# Patient Record
Sex: Male | Born: 1960 | Race: White | Hispanic: No | Marital: Married
Health system: Midwestern US, Community
[De-identification: ages and names within clinical notes are randomized; demographics above are authoritative.]

## PROBLEM LIST (undated history)

## (undated) DIAGNOSIS — I480 Paroxysmal atrial fibrillation: Principal | ICD-10-CM

## (undated) DIAGNOSIS — I059 Rheumatic mitral valve disease, unspecified: Secondary | ICD-10-CM

## (undated) DIAGNOSIS — C3412 Malignant neoplasm of upper lobe, left bronchus or lung: Principal | ICD-10-CM

## (undated) DIAGNOSIS — I10 Essential (primary) hypertension: Secondary | ICD-10-CM

## (undated) DIAGNOSIS — C3402 Malignant neoplasm of left main bronchus: Secondary | ICD-10-CM

## (undated) DIAGNOSIS — I4819 Other persistent atrial fibrillation: Secondary | ICD-10-CM

---

## 2015-02-20 ENCOUNTER — Ambulatory Visit
Admit: 2015-02-20 | Discharge: 2015-02-20 | Payer: PRIVATE HEALTH INSURANCE | Attending: Cardiovascular Disease | Primary: Internal Medicine

## 2015-02-20 DIAGNOSIS — R0602 Shortness of breath: Secondary | ICD-10-CM

## 2015-02-20 NOTE — Progress Notes (Signed)
02/20/2015     Bobby Guzman, [address]  [place] [address]        Patient:  Bobby Guzman   DOB:  Sep 19, 1961       CHIEF COMPLAINT:   Chief Complaint   Patient presents with   ??? Shortness of Breath     s/p endo or myocarditis , lung CA s/p LUL    ??? Chest Pain (Angina)     at [facility] 2015   ??? Dizziness   ??? Palpitations   ??? Leg Swelling     leg pain   ??? Sleep Apnea     has cpap       Dear Dr. Hanley Seamen,    HISTORY OF PRESENT ILLNESS: Today I had the pleasure of seeing your patient, Bobby Guzman in cardiology consultation. As you know, he is a 54 y.o. year old male who  has a past medical history of Lung cancer ([place]) (2011); Sleep apnea; Essential hypertension; Diabetes ([place]); and Endocarditis (2015). presenting for cardiac evaluation.    DOE -- unchanged >1 yr. Denies CP/pressure. Occasional mild dizziness. Denies syncope. Denies palpitations. Notes claudication bilaterally with prolonged walking.    PAST MEDICAL HISTORY:  Past Medical History   Diagnosis Date   ??? Lung cancer ([place]) 2011     s/p LUL resection and chemo   ??? Sleep apnea    ??? Essential hypertension    ??? Diabetes ([place])    ??? Endocarditis 2015     dx [facility]        PAST SURGICAL HISTORY:  Past Surgical History   Procedure Laterality Date   ??? Hx other surgical  2011     LUL resection       MEDICATIONS:  Current Outpatient Prescriptions   Medication Sig Dispense Refill   ??? candesartan (ATACAND) 4 mg tablet Take  by mouth daily.     ??? rosuvastatin (CRESTOR) 10 mg tablet Take 10 mg by mouth nightly.     ??? sitaGLIPtin-metFORMIN (JANUMET) 50-1,000 mg per tablet Take 1 Tab by mouth two (2) times daily (with meals).           ALLERGIES:   Allergies not on file    SOCIAL HISTORY:  History     Social History   ??? Marital Status: MARRIED     Spouse Name: N/A   ??? Number of Children: N/A   ??? Years of Education: N/A     Social History Main Topics   ??? Smoking status: Former Smoker -- 2.00 packs/day for 34 years     Types: Cigarettes      Quit date: 02/20/2011   ??? Smokeless tobacco: Never Used      Comment: e- cig no nicotine   ??? Alcohol Use: 0.0 oz/week     0 Standard drinks or equivalent per week      Comment: 2shots or beers /week   ??? Drug Use: Not on file   ??? Sexual Activity: Not on file     Other Topics Concern   ??? None     Social History Narrative   ??? None        FAMILY HISTORY:  Family History   Problem Relation Age of Onset   ??? Stroke Mother    ??? Heart Attack Father    ??? High Cholesterol Sister    ??? Hypertension Sister    ??? High Cholesterol Brother    ??? Hypertension Brother  REVIEW OF SYSTEMS:  Neuro: No headache, focal weakness/numbness, parasthesias  Eyes: No visual disturbance  ENMT: No soar throat, nasal congestion, hearing loss, tinnitus  CV: No chest pain, palpitations, dizziness, syncope, claudication  Resp: dyspnea  GI: No abdominal pain, N/V, diarrhea, constipation  GU: No hematuria, dysuria  Skin: No rash  Heme: No bleeding, bruising  Constitutional: No fatigue, weakness, fever, weight change  Musculoskeletal: myalgias, arthralgias  Other:    PHYSICAL EXAM:  Blood pressure 108/80, pulse 85, height 5\' 8"  (1.727 m), weight 256 lb (116.121 kg), SpO2 98 %.   General: Well developed, well nourished, no acute distress, appears stated age  Eyes: Anicteric, no hemorrhage, normal appearing conjunctivae and lids  ENMT: Normal externally  Neck: Supple, no JVD, no carotid bruit  CV: RRR, normal S1S2, 3/6 HSM apex  Lungs: CTA bilaterally, normal respiratory excursion  Abd: Soft, NT, obese  Extremities: No pedal edema  Skin: Warm and dry  Musculoskeletal: Normal muscle tone, normal gait  Neuro: Nonfocal exam, no rigidity/tremor  Psych: Alert and oriented, appropriate affect    LABS AND TESTING:  ?? EKG: 02/16/15 - NSR, Q III and V3, PRWP, no ST/T changes  ?? Labs: 02/16/15 - A1c 9, TSH nl, CMP nl (x gluc 213), LDL 64, HDL 26, TG 314, CBC nl (x wbc 12.4)  ?? Echo 09/2014 (in [place], reviewed disk) - nl LV/RV size/fxn, moderate  basal septal hypertrophy, severe LAE, mild SAM without significant gradient, prob moderate MR (difficult to assess severity)    ASSESSMENT AND PLAN:    1. SOB - The described shortness of breath has a broad differential diagnosis including various cardiac and pulmonary etiologies, generalized deconditioning, etc. The patient has multiple cardiovascular risk factors. Will check an echocardiogram to reeevaluate mitral valve and assess for any structural heart disease otherwise. States he has had negative previous ischemic workup, although records not available to me at this time. Will obtain previous records from other cardiologist and/or [facility].  2. Hx endocarditis, mitral regurgitation - Severity difficult to assess based on 09/2014 echo and I am suspicious that it is worse than it appears on those limited images. Will repeat echo and consider TEE. Dental endocarditis ppx.  3. HTN - Blood pressure is well controlled. Continue current antihypertensive regimen.  4. Dyslipidemia - Lipids are abnormal as described. LDL controlled. Continue crestor and follow labs.  5. Claudication - Multiple CV risk factors. Check ABI/PVR.        Thank you very much for this consultation, I will keep you updated with the results of the above testing.    Sincerely,    Bobby Bash, MD

## 2015-02-24 ENCOUNTER — Encounter: Payer: PRIVATE HEALTH INSURANCE | Primary: Internal Medicine

## 2015-02-27 ENCOUNTER — Ambulatory Visit
Admit: 2015-02-27 | Discharge: 2015-02-27 | Payer: PRIVATE HEALTH INSURANCE | Attending: Cardiovascular Disease | Primary: Internal Medicine

## 2015-02-27 DIAGNOSIS — R0602 Shortness of breath: Secondary | ICD-10-CM

## 2015-02-27 NOTE — Progress Notes (Signed)
02/27/2015     Patient:  Bobby Guzman   DOB:  1961/06/16       CHIEF COMPLAINT:   Chief Complaint   Patient presents with   ??? Shortness of Breath         HISTORY OF PRESENT ILLNESS: Bobby Guzman presents for cardiology follow up. No changes since last visit. DOE unchanged. Denies recent CP, dizziness, palps.    PAST MEDICAL HISTORY:  Past Medical History   Diagnosis Date   ??? Lung cancer ([place]) 2011     s/p LUL resection and chemo   ??? Sleep apnea    ??? Essential hypertension    ??? Diabetes ([place])    ??? Endocarditis 2012     dx [facility]; mitral valve        PAST SURGICAL HISTORY:  Past Surgical History   Procedure Laterality Date   ??? Hx other surgical  2011     LUL resection       MEDICATIONS:  Current Outpatient Prescriptions   Medication Sig Dispense Refill   ??? candesartan (ATACAND) 4 mg tablet Take  by mouth daily.     ??? rosuvastatin (CRESTOR) 10 mg tablet Take 10 mg by mouth nightly.     ??? sitaGLIPtin-metFORMIN (JANUMET) 50-1,000 mg per tablet Take 1 Tab by mouth two (2) times daily (with meals).          ALLERGIES:   Not on File    SOCIAL HISTORY:  History     Social History   ??? Marital Status: MARRIED     Spouse Name: N/A   ??? Number of Children: N/A   ??? Years of Education: N/A     Social History Main Topics   ??? Smoking status: Former Smoker -- 2.00 packs/day for 34 years     Types: Cigarettes     Quit date: 02/20/2011   ??? Smokeless tobacco: Never Used      Comment: e- cig no nicotine   ??? Alcohol Use: 0.0 oz/week     0 Standard drinks or equivalent per week      Comment: 2shots or beers /week   ??? Drug Use: Not on file   ??? Sexual Activity: Not on file     Other Topics Concern   ??? None     Social History Narrative         FAMILY HISTORY:  Family History   Problem Relation Age of Onset   ??? Stroke Mother    ??? Heart Attack Father    ??? High Cholesterol Sister    ??? Hypertension Sister    ??? High Cholesterol Brother    ??? Hypertension Brother        REVIEW OF SYSTEMS:   Neuro: No headache, focal weakness/numbness, parasthesias  Eyes: No visual disturbance  ENMT: No soar throat, nasal congestion, hearing loss, tinnitus  CV: No chest pain, palpitations, dizziness, syncope, claudication  Resp: dyspnea  GI: No abdominal pain, N/V, diarrhea, constipation  GU: No hematuria, dysuria  Skin: No rash  Heme: No bleeding, bruising  Constitutional: No fatigue, weakness, fever, weight change  Musculoskeletal: No myalgias, arthralgias  Other:    PHYSICAL EXAM:  Blood pressure 122/80, pulse 86, height 5\' 8"  (1.727 m), weight 256 lb (116.121 kg), SpO2 98 %.   General: Well developed, well nourished, no acute distress, appears stated age  Eyes: Anicteric, no hemorrhage, normal appearing conjunctivae and lids  ENMT: Normal externally  Neck: Supple, no JVD  CV: RRR, normal S1S2,  3/6 HSM apex  Lungs: CTA bilaterally, normal respiratory excursion  Abd: Soft, NT, ND, no palpable mass  Extremities: No pedal edema  Skin: Warm and dry  Musculoskeletal: Normal muscle tone, normal gait  Neuro: Nonfocal exam, no rigidity/tremor  Psych: Alert and oriented, appropriate affect      LABS AND TESTING:  ?? Labs: 02/16/15 - A1c 9, TSH nl, CMP nl (x gluc 213), LDL 64, HDL 26, TG 314, CBC nl (x wbc 12.4)  ?? Echo 09/2014 (in [place], reviewed disk) - nl LV/RV size/fxn, moderate basal septal hypertrophy, severe LAE, mild SAM without significant gradient, prob moderate MR (difficult to assess severity)  ?? Echo 02/24/15 - nl LV/RV size/fxn, moderate ASH, severe LAE, prob moderate MR    ASSESSMENT AND PLAN:    1. SOB - The described shortness of breath has a broad differential diagnosis including various cardiac and pulmonary etiologies, generalized deconditioning, etc. Repeat echo again shows probable moderate MR. Will arrange for TEE for further assessment. Also would pursue ischemic workup given symptoms and CAD risk factors (reportedly had some testing in [place]  in the past yr -- will review records) -- specific testing would be determined by TEE results and previous studies. Pulm etiology remains a possibility as well given his history.  2. HTN - Blood pressure is well controlled. Continue current antihypertensive regimen.  3. Dyslipidemia - Lipids are abnormal as described. LDL controlled. Continue crestor and follow labs.  4. Hx endocarditis - Resulting MR and plan on TEE as above. Dental endocarditis ppx and serial echo.        Bobby Bash, MD

## 2015-02-27 NOTE — H&P (View-Only) (Signed)
02/27/2015     Patient:  Bobby Guzman   DOB:  Jan 11, 1961       CHIEF COMPLAINT:   Chief Complaint   Patient presents with   ??? Shortness of Breath         HISTORY OF PRESENT ILLNESS: Bobby Guzman presents for cardiology follow up. No changes since last visit. DOE unchanged. Denies recent CP, dizziness, palps.    PAST MEDICAL HISTORY:  Past Medical History   Diagnosis Date   ??? Lung cancer ([place]) 2011     s/p LUL resection and chemo   ??? Sleep apnea    ??? Essential hypertension    ??? Diabetes ([place])    ??? Endocarditis 2012     dx [facility]; mitral valve        PAST SURGICAL HISTORY:  Past Surgical History   Procedure Laterality Date   ??? Hx other surgical  2011     LUL resection       MEDICATIONS:  Current Outpatient Prescriptions   Medication Sig Dispense Refill   ??? candesartan (ATACAND) 4 mg tablet Take  by mouth daily.     ??? rosuvastatin (CRESTOR) 10 mg tablet Take 10 mg by mouth nightly.     ??? sitaGLIPtin-metFORMIN (JANUMET) 50-1,000 mg per tablet Take 1 Tab by mouth two (2) times daily (with meals).          ALLERGIES:   Not on File    SOCIAL HISTORY:  History     Social History   ??? Marital Status: MARRIED     Spouse Name: N/A   ??? Number of Children: N/A   ??? Years of Education: N/A     Social History Main Topics   ??? Smoking status: Former Smoker -- 2.00 packs/day for 34 years     Types: Cigarettes     Quit date: 02/20/2011   ??? Smokeless tobacco: Never Used      Comment: e- cig no nicotine   ??? Alcohol Use: 0.0 oz/week     0 Standard drinks or equivalent per week      Comment: 2shots or beers /week   ??? Drug Use: Not on file   ??? Sexual Activity: Not on file     Other Topics Concern   ??? None     Social History Narrative         FAMILY HISTORY:  Family History   Problem Relation Age of Onset   ??? Stroke Mother    ??? Heart Attack Father    ??? High Cholesterol Sister    ??? Hypertension Sister    ??? High Cholesterol Brother    ??? Hypertension Brother        REVIEW OF SYSTEMS:   Neuro: No headache, focal weakness/numbness, parasthesias  Eyes: No visual disturbance  ENMT: No soar throat, nasal congestion, hearing loss, tinnitus  CV: No chest pain, palpitations, dizziness, syncope, claudication  Resp: dyspnea  GI: No abdominal pain, N/V, diarrhea, constipation  GU: No hematuria, dysuria  Skin: No rash  Heme: No bleeding, bruising  Constitutional: No fatigue, weakness, fever, weight change  Musculoskeletal: No myalgias, arthralgias  Other:    PHYSICAL EXAM:  Blood pressure 122/80, pulse 86, height 5\' 8"  (1.727 m), weight 256 lb (116.121 kg), SpO2 98 %.   General: Well developed, well nourished, no acute distress, appears stated age  Eyes: Anicteric, no hemorrhage, normal appearing conjunctivae and lids  ENMT: Normal externally  Neck: Supple, no JVD  CV: RRR, normal S1S2,  3/6 HSM apex  Lungs: CTA bilaterally, normal respiratory excursion  Abd: Soft, NT, ND, no palpable mass  Extremities: No pedal edema  Skin: Warm and dry  Musculoskeletal: Normal muscle tone, normal gait  Neuro: Nonfocal exam, no rigidity/tremor  Psych: Alert and oriented, appropriate affect      LABS AND TESTING:  ?? Labs: 02/16/15 - A1c 9, TSH nl, CMP nl (x gluc 213), LDL 64, HDL 26, TG 314, CBC nl (x wbc 12.4)  ?? Echo 09/2014 (in [place], reviewed disk) - nl LV/RV size/fxn, moderate basal septal hypertrophy, severe LAE, mild SAM without significant gradient, prob moderate MR (difficult to assess severity)  ?? Echo 02/24/15 - nl LV/RV size/fxn, moderate ASH, severe LAE, prob moderate MR    ASSESSMENT AND PLAN:    1. SOB - The described shortness of breath has a broad differential diagnosis including various cardiac and pulmonary etiologies, generalized deconditioning, etc. Repeat echo again shows probable moderate MR. Will arrange for TEE for further assessment. Also would pursue ischemic workup given symptoms and CAD risk factors (reportedly had some testing in [place]  in the past yr -- will review records) -- specific testing would be determined by TEE results and previous studies. Pulm etiology remains a possibility as well given his history.  2. HTN - Blood pressure is well controlled. Continue current antihypertensive regimen.  3. Dyslipidemia - Lipids are abnormal as described. LDL controlled. Continue crestor and follow labs.  4. Hx endocarditis - Resulting MR and plan on TEE as above. Dental endocarditis ppx and serial echo.        [name], MD

## 2015-03-03 ENCOUNTER — Inpatient Hospital Stay: Payer: PRIVATE HEALTH INSURANCE | Primary: Internal Medicine

## 2015-03-03 NOTE — Anesthesia Pre-Procedure Evaluation (Addendum)
Anesthetic History               Review of Systems / Medical History      Pulmonary        Sleep apnea  Shortness of breath         Neuro/Psych              Cardiovascular    Hypertension  Valvular problems/murmurs    CHF: dyspnea on exertion        Exercise tolerance: <4 METS  Comments: Endocarditis  Moderate MR on TTE    Ischemic work up in progress   GI/Hepatic/Renal                Endo/Other    Diabetes: type 2    Cancer     Other Findings   Comments: Lung cancer              Anesthetic Plan    ASA: 4  Anesthesia type: MAC          Induction: Intravenous  Anesthetic plan and risks discussed with: Patient

## 2015-03-06 ENCOUNTER — Encounter: Payer: PRIVATE HEALTH INSURANCE | Primary: Internal Medicine

## 2015-03-07 ENCOUNTER — Inpatient Hospital Stay: Admit: 2015-03-07 | Payer: PRIVATE HEALTH INSURANCE | Attending: Cardiovascular Disease | Primary: Internal Medicine

## 2015-03-07 DIAGNOSIS — I340 Nonrheumatic mitral (valve) insufficiency: Secondary | ICD-10-CM

## 2015-03-07 LAB — CBC W/O DIFF
HCT: 42.1 % (ref 42.0–52.0)
HGB: 13.8 g/dL — ABNORMAL LOW (ref 14.0–18.0)
MCH: 28.3 PG (ref 27.0–35.0)
MCHC: 32.8 g/dL (ref 30.7–37.3)
MCV: 86.3 FL (ref 81.0–94.0)
MPV: 10.3 FL (ref 9.2–11.8)
PLATELET: 255 10*3/uL (ref 130–400)
RBC: 4.88 M/uL (ref 4.70–6.00)
RDW: 14.3 % — ABNORMAL HIGH (ref 11.5–14.0)
WBC: 13.2 10*3/uL — ABNORMAL HIGH (ref 4.8–10.6)

## 2015-03-07 LAB — EKG, 12 LEAD, INITIAL
Atrial Rate: 77 {beats}/min
Calculated P Axis: 53 degrees
Calculated R Axis: 6 degrees
Calculated T Axis: 39 degrees
Diagnosis: NORMAL
P-R Interval: 188 ms
Q-T Interval: 382 ms
QRS Duration: 108 ms
QTC Calculation (Bezet): 432 ms
Ventricular Rate: 77 {beats}/min

## 2015-03-07 LAB — METABOLIC PANEL, BASIC
Anion gap: 11 mmol/L (ref 10–20)
BUN: 21 mg/dL — ABNORMAL HIGH (ref 7–18)
CO2: 24 mmol/L (ref 21–32)
Calcium: 9 mg/dL (ref 8.5–10.1)
Chloride: 105 mmol/L (ref 98–107)
Creatinine: 1.2 mg/dL (ref 0.70–1.30)
GFR est AA: >60 mL/min/{1.73_m2} (ref >60)
GFR est non-AA: >60 mL/min/{1.73_m2} (ref >60)
Glucose: 199 mg/dL — ABNORMAL HIGH (ref 74–106)
Potassium: 4.6 mmol/L (ref 3.5–5.1)
Sodium: 135 mmol/L — ABNORMAL LOW (ref 136–145)

## 2015-03-07 MED ORDER — BENZOCAINE 20 % MUCOSAL SPRAY
20 % | Status: DC
Start: 2015-03-07 — End: 2015-03-07

## 2015-03-07 MED ORDER — PROPOFOL 10 MG/ML IV EMUL
10 mg/mL | INTRAVENOUS | Status: DC | PRN
Start: 2015-03-07 — End: 2015-03-07
  Administered 2015-03-07: 12:00:00 via INTRAVENOUS

## 2015-03-07 MED ORDER — BENZOCAINE 20 % MUCOSAL AEROSOL SPRAY
20 % | Status: DC | PRN
Start: 2015-03-07 — End: 2015-03-07
  Administered 2015-03-07: 12:00:00 via TOPICAL

## 2015-03-07 MED ORDER — SODIUM CHLORIDE 0.9 % IJ SYRG
INTRAMUSCULAR | Status: DC | PRN
Start: 2015-03-07 — End: 2015-03-07

## 2015-03-07 MED ORDER — SODIUM CHLORIDE 0.9 % IJ SYRG
Freq: Three times a day (TID) | INTRAMUSCULAR | Status: DC
Start: 2015-03-07 — End: 2015-03-07

## 2015-03-07 MED FILL — HURRICAINE ONE 20 % MUCOSAL SPRAY: 20 % | Qty: 1

## 2015-03-07 MED FILL — BD POSIFLUSH NORMAL SALINE 0.9 % INJECTION SYRINGE: INTRAMUSCULAR | Qty: 10

## 2015-03-07 NOTE — Brief Op Note (Signed)
BRIEF OPERATIVE NOTE    Date of Procedure: 03/07/2015    Preoperative Diagnosis: MR  Postoperative Diagnosis: MR  Procedure: TEE  Physician: Dr. Ida Rogue  Anesthesia: MAC  Estimated Blood Loss: none  Specimens: none  Findings:   TEE (including R/B/A) explained and consent signed. Sedated by anesthesia. Probe passed into esophagus and stomach and images obtained.    Briefly: normal LV/RV size/fxn, anterior mitral leaflet with a small rupture/defect in (?) A2, severe MR    Patient tolerated procedure well.   Results discussed with patient and son.   Full report to follow.    Complications: none  Implants: none

## 2015-03-07 NOTE — Progress Notes (Signed)
Please enter patients weight and allergies into system.

## 2015-03-07 NOTE — Other (Signed)
Py on med from [place] for heart rate [address]OHK[address] DR Najovitis informed , unable to obtain coags doctor informed . Wbc 13

## 2015-03-07 NOTE — Anesthesia Post-Procedure Evaluation (Signed)
Post-Anesthesia Evaluation and Assessment    Patient: Bobby Guzman MRN: [id number]  SSN: [id number]    Date of Birth: Jul 02, 1961  Age: 54 y.o.  Sex: male       Cardiovascular Function/Vital Signs  Visit Vitals   Item Reading   ??? BP 80/47 mmHg   ??? Pulse 83   ??? Temp 35.9 ??C (96.6 ??F)   ??? Resp 20   ??? Ht 5\' 7"  (1.702 m)   ??? Wt 113.399 kg (250 lb)   ??? BMI 39.15 kg/m2   ??? SpO2 99%       Patient is status post MAC anesthesia   VSS  Report given    Signed By: Rondell Reams, MD     March 07, 2015

## 2015-03-07 NOTE — Anesthesia Pre-Procedure Evaluation (Addendum)
Anesthetic History               Review of Systems / Medical History  Patient summary reviewed, nursing notes reviewed and pertinent labs reviewed    Pulmonary        Sleep apnea           Neuro/Psych              Cardiovascular    Hypertension                Comments: Moderate MR   GI/Hepatic/Renal                Endo/Other    Diabetes         Other Findings   Comments: Swallowing problems  EGD negative per patient         Physical Exam    Airway  Mallampati: III  TM Distance: > 6 cm  Neck ROM: decreased range of motion   Mouth opening: Normal     Cardiovascular    Rhythm: regular  Rate: normal         Dental    Dentition: Caps/crowns     Pulmonary  Breath sounds clear to auscultation               Abdominal  Abdominal exam normal       Other Findings            Anesthetic Plan    ASA: 4  Anesthesia type: MAC          Induction: Intravenous  Anesthetic plan and risks discussed with: Patient

## 2015-03-07 NOTE — Interval H&P Note (Signed)
H&P Update:  Bobby Guzman was seen and examined.  History and physical has been reviewed. The patient has been examined. There have been no significant clinical changes since the completion of the originally dated History and Physical.    Signed By: [name], MD     March 07, 2015 8:28 AM

## 2015-03-08 ENCOUNTER — Encounter: Attending: Cardiovascular Disease | Primary: Internal Medicine

## 2015-03-14 ENCOUNTER — Ambulatory Visit
Admit: 2015-03-14 | Discharge: 2015-03-14 | Payer: PRIVATE HEALTH INSURANCE | Attending: Cardiovascular Disease | Primary: Internal Medicine

## 2015-03-14 DIAGNOSIS — R0602 Shortness of breath: Secondary | ICD-10-CM

## 2015-03-14 MED ORDER — ROSUVASTATIN 10 MG TAB
10 mg | ORAL_TABLET | Freq: Every evening | ORAL | Status: DC
Start: 2015-03-14 — End: 2015-03-20

## 2015-03-14 MED ORDER — CANDESARTAN 4 MG TAB
4 mg | ORAL_TABLET | Freq: Every day | ORAL | Status: DC
Start: 2015-03-14 — End: 2015-03-16

## 2015-03-14 MED ORDER — METOPROLOL SUCCINATE SR 25 MG 24 HR TAB
25 mg | ORAL_TABLET | Freq: Every day | ORAL | Status: DC
Start: 2015-03-14 — End: 2015-03-16

## 2015-03-14 MED ORDER — FUROSEMIDE 20 MG TAB
20 mg | ORAL_TABLET | Freq: Every day | ORAL | Status: DC
Start: 2015-03-14 — End: 2015-03-16

## 2015-03-14 NOTE — Progress Notes (Signed)
03/14/2015     Patient:  Bobby Guzman   DOB:  1961/05/18       CHIEF COMPLAINT:   Chief Complaint   Patient presents with   ??? Hypertension     S/P TEE         HISTORY OF PRESENT ILLNESS: Bobby Guzman presents for cardiology follow up. DOE unchanged. Denies CP, dizziness, palpitations.    PAST MEDICAL HISTORY:  Past Medical History   Diagnosis Date   ??? Lung cancer ([place]) 2011     s/p LUL resection and chemo   ??? Sleep apnea    ??? Essential hypertension    ??? Diabetes ([place])    ??? Endocarditis 2012     dx [facility]; mitral valve        PAST SURGICAL HISTORY:  Past Surgical History   Procedure Laterality Date   ??? Hx other surgical  2011     LUL resection       MEDICATIONS:  Current Outpatient Prescriptions   Medication Sig Dispense Refill   ??? sub-q insulin device, 40 unit devi by Does Not Apply route.     ??? candesartan (ATACAND) 4 mg tablet Take  by mouth daily.     ??? rosuvastatin (CRESTOR) 10 mg tablet Take 10 mg by mouth nightly.          ALLERGIES:   No Known Allergies    SOCIAL HISTORY:  History     Social History   ??? Marital Status: MARRIED     Spouse Name: N/A   ??? Number of Children: N/A   ??? Years of Education: N/A     Social History Main Topics   ??? Smoking status: Former Smoker -- 2.00 packs/day for 34 years     Types: Cigarettes     Quit date: 02/20/2011   ??? Smokeless tobacco: Never Used      Comment: e- cig no nicotine   ??? Alcohol Use: 0.0 oz/week     0 Standard drinks or equivalent per week      Comment: 2shots or beers /week   ??? Drug Use: No   ??? Sexual Activity: Not on file     Other Topics Concern   ??? None     Social History Narrative         FAMILY HISTORY:  Family History   Problem Relation Age of Onset   ??? Stroke Mother    ??? Heart Attack Father    ??? High Cholesterol Sister    ??? Hypertension Sister    ??? High Cholesterol Brother    ??? Hypertension Brother        REVIEW OF SYSTEMS:  Neuro: No headache, focal weakness/numbness, parasthesias  Eyes: No visual disturbance   ENMT: No soar throat, nasal congestion, hearing loss, tinnitus  CV: No chest pain, palpitations, dizziness, syncope, claudication  Resp: dyspnea  GI: No abdominal pain, N/V, diarrhea, constipation  GU: No hematuria, dysuria  Skin: No rash  Heme: No bleeding, bruising  Constitutional: No fatigue, weakness, fever, weight change  Musculoskeletal: No myalgias, arthralgias  Other:    PHYSICAL EXAM:  Blood pressure 112/80, pulse 78, height 5' 7.72" (1.72 m), weight 256 lb (116.121 kg), SpO2 96 %.   General: Well developed, well nourished, no acute distress, appears stated age  Eyes: Anicteric, no hemorrhage, normal appearing conjunctivae and lids  ENMT: Normal externally  Neck: Supple, no JVD  CV: RRR, normal S1S2, 3/6 HSM apex  Lungs: CTA bilaterally, normal respiratory excursion  Abd: Soft, NT, obese  Extremities: No pedal edema  Skin: Warm and dry  Musculoskeletal: Normal muscle tone, normal gait  Neuro: Nonfocal exam, no rigidity/tremor  Psych: Alert and oriented, appropriate affect      LABS AND TESTING:  ?? Labs: 03/07/15 - CBC nl (x wbc 13.2), BMP nl (x gluc 199)  ?? TEE 01/24/11 - nl LVEF, vegetations on both MV leaflets, moderate MR  ?? Echo 09/2014 (in [place], reviewed disk) - nl LV/RV size/fxn, moderate basal septal hypertrophy, severe LAE, mild SAM without significant gradient, prob moderate MR (difficult to assess severity)  ?? Echo 02/24/15 - nl LV/RV size/fxn, moderate ASH, severe LAE, prob moderate MR  ?? ABI/PVR 03/06/15 - nl  ?? TEE 03/07/15 - nl LV/RV size/fxn, anterior MV leaflet with rupture, severe MR    ASSESSMENT AND PLAN:    1. SOB, hx endocarditis, severe MR - SOB likely multifactorial and in large part due to MR (in addition to lung dz). I've recommended MVR and have referred him to Dr. Andree Elk at [facility] (discussed case with surgeon and fed-ex'ed TEE disc). He is refusing procedure at this time and insists he has to travel to [place] to get his business affairs in order. He states  he plans on meeting with Dr. Andree Elk prior to his trip. Will followup with me as soon as he is back in the country. For now, will add toprol XL 25 and lasix 20, and continue her other meds.  2. HTN - Blood pressure is well controlled. Continue current antihypertensive regimen and add toprol/lasix as above.  3. Dyslipidemia - Recent lipids unknown. Continue current medications and follow up labs.        Bobby Bash, MD

## 2015-03-16 MED ORDER — CANDESARTAN 4 MG TAB
4 mg | ORAL_TABLET | Freq: Every day | ORAL | Status: DC
Start: 2015-03-16 — End: 2015-06-01

## 2015-03-16 MED ORDER — FUROSEMIDE 20 MG TAB
20 mg | ORAL_TABLET | Freq: Every day | ORAL | Status: DC
Start: 2015-03-16 — End: 2015-06-01

## 2015-03-16 MED ORDER — METOPROLOL SUCCINATE SR 25 MG 24 HR TAB
25 mg | ORAL_TABLET | Freq: Every day | ORAL | Status: DC
Start: 2015-03-16 — End: 2015-10-17

## 2015-03-16 MED FILL — DIPRIVAN 10 MG/ML INTRAVENOUS EMULSION: 10 mg/mL | INTRAVENOUS | Qty: 102.06

## 2015-03-16 MED FILL — HURRICAINE ONE 20 % MUCOSAL SPRAY: 20 % | Qty: 1

## 2015-03-17 ENCOUNTER — Ambulatory Visit
Admit: 2015-03-17 | Discharge: 2015-03-17 | Payer: PRIVATE HEALTH INSURANCE | Attending: Medical Oncology | Primary: Internal Medicine

## 2015-03-17 DIAGNOSIS — C3412 Malignant neoplasm of upper lobe, left bronchus or lung: Secondary | ICD-10-CM

## 2015-03-17 NOTE — Progress Notes (Signed)
Has been seeing Dr. Charlott Holler annually for follow up of lung cancer diagnosed in 2011. Has annual CT scan of the chest abd and pelvis. Last was done in Jan 2015. Dr. Ellwood Dense examined Bobby Guzman. Advised to have a CT of the Chest, Abdomen and Pelvis with contrast. Also instructed to contact Dr. Caren Hazy' office to make an appointment to have his port removed. Will return in 6 months.

## 2015-03-17 NOTE — Progress Notes (Signed)
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 The details of the patient's history of present illness, past medical, surgical, family and social history have all been reviewed.    The patient went for routine checkup and was found to have an abnormality in his left lung on chest x-ray.  This was followed by a CAT scan and further workup leading to surgical resection in [place].  The original pathology report is in  [place].  I have reviewed the translation and the patient had a moderately to well-differentiated squamous cell carcinoma in the left upper lobe excision specimen.  39 lymph nodes were negative for any evidence of metastatic spread: Stage IIB (pT3,pN0,M0).  The patient was not recommended any further adjuvant treatment in [place] but was seen in Medical Oncology consultation in [place] and was advised to have adjuvant chemotherapy.  He was seen in [place] by Dr. Charlott Holler who  also recommended adjuvant chemotherapy with Navelbine and Cisplatin as per NCCN guidelines.  This was started on now 09/29/2010 and according to Dr. Ansel Bong notes he had an elevated pretreatment CA-125 at 27 which eventually decreased to normal.  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  He had an inflamed anal fissure.  The patient travels back and forth between the [place] and [place] on business.  He still has a MediPort which he states he gets flushed once a year.  He has been recommended to have the MediPort removed but informed me that he was waiting for 5 years to be up before he would remove the MediPort.  He denies any fevers or chills.  The patient has surveillance scans every 6 months and is due for a CAT scan of the chest abdomen and pelvis which was last done in [place] in September 2015 and in [place] in January 2015.    The patient denies any allergies.  He smoked at least 1-1/2-2 packs per day for several years and quit smoking 5 years ago.  He has a history of diabetes mellitus and is on insulin.  He has a history of hypertension and takes Atacand.  He developed mitral valve regurgitation following the episode of endocarditis and has symptoms of dyspnea on exertion.  He is overweight since he stopped smoking and is trying to lose weight.  He has seen Dr. Ida Rogue  from Cardiology and has been recommended to have mitral valve surgery which he plans to have this Fall.  He has a history of ulcerative colitis which has not been a problem  since he had chemotherapy.  He has a history of sleep apnea and is known to have elevated lipids.  The patient was born in [place] and has lived in the [place] for the past 25 years.  He states that one half-sister died of some kind of cancer but he is unsure as as to the details.  He has 2 sons who are in good health.     His medications were reviewed.    DX   Encounter Diagnosis   Name Primary?   ??? Primary malignant neoplasm of left upper lobe of lung ([place]) Yes        Past Medical History   Diagnosis Date   ??? Lung cancer ([place]) 2011     s/p LUL resection and chemo   ??? Sleep apnea    ??? Essential hypertension    ??? Diabetes ([place])    ??? Endocarditis 2012     dx [facility]; mitral valve   ??? Primary malignant neoplasm of left upper lobe of lung ([facility]) 03/16/2015      Past Surgical History   Procedure Laterality Date   ??? Hx other surgical  2011     LUL resection      Current Outpatient Prescriptions   Medication Sig   ??? candesartan (ATACAND) 4 mg tablet Take 1 Tab by mouth daily.   ??? furosemide (LASIX) 20 mg tablet Take 1 Tab by mouth daily.   ??? metoprolol succinate (TOPROL-XL) 25 mg XL tablet Take 1 Tab by mouth daily.   ??? sub-q insulin device, 40 unit devi by Does Not Apply route.   ??? rosuvastatin (CRESTOR) 10 mg tablet Take 1 Tab by mouth nightly.     No current facility-administered medications for this visit.      No Known Allergies   History   Substance Use Topics   ??? Smoking status: Former Smoker -- 2.00 packs/day for 34 years     Types: Cigarettes     Quit date: 02/20/2011   ??? Smokeless tobacco: Never Used      Comment: e- cig no nicotine   ??? Alcohol Use: 0.0 oz/week     0 Standard drinks or equivalent per week      Comment: 2shots or beers /week      Family History   Problem Relation Age of Onset   ??? Stroke Mother    ??? Heart Attack Father    ??? High Cholesterol Sister    ??? Hypertension Sister    ??? High Cholesterol Brother    ??? Hypertension Brother         Review of Systems:  A detailed 10 organ review of systems is obtained with pertinent positives as listed in the History of Present Illness and Past Medical History. All others are negative.    Physical Exam:  BP 116/76 mmHg   Pulse 80   Temp(Src) 97.4 ??F (36.3 ??C) (Oral)   Ht 5\' 8"  (1.727 m)   Wt 254 lb 3.2 oz (115.304 kg)   BMI 38.66 kg/m2     General appearance  WD, WN Caucasian male, alert, cooperative, no distress, appears stated age   Head  Normocephalic, without obvious abnormality, atraumatic   Eyes  PERRL, EOM's intact.   Nose Nares normal. Septum midline. Mucosa normal. No drainage or sinus tenderness.   Throat Lips, mucosa, and tongue normal. Teeth and gums normal.  Oropharynx clear.   Neck supple, symmetrical, trachea midline, no adenopathy, thyroid: not enlarged, symmetric, no tenderness/mass/nodules,no  JVD   Back   No midline or CVA tenderness   Lungs   clear to auscultation bilaterally   Heart  regular rate and rhythm, S1, S2 normal, note IV/VI holosystolic at apex    Abdomen   soft, obese, non-tender, non-distended. Bowel sounds normal. No masses,  No organomegaly   Extremities extremities normal, atraumatic, no cyanosis or edema   Skin Skin color, texture, turgor normal. No rashes or lesions   Lymph nodes Nonpalpable   Neurologic Nonfocal       Labs:   No results found for this or any previous visit (from the past 24 hour(s)).  Recent lab work from Dr. Hanley Seamen reviewed      Impression and Plan:  The patient appears to be in remission with regards to his Stage II B (pT3,pN0,M0) Squamous cell carcinoma of the Left Upper Lobe s/p Left Upper Lobectomy in July 2011 followed by adjuvant chemotherapy with Navelbine and Cisplatin which was complicated by endocarditis and development of Mitral regurgitation.    He still has a mediport. His mediport will be flushed today. I have strongly advised him to have the Mediport removed and he was advised to see Dr. Caren Hazy.    NCCN surveillance guidelines were reviewed. He will have a CT scan of the Chest, abdomen and pelvis.    I have recommended that he lose weight and modify his diet.      ICD-10-CM ICD-9-CM    1. Primary malignant neoplasm of left upper lobe of lung (HCC) C34.12 162.3 CT CHEST W CONT      CT ABD PELV W CONT     Follow-up Disposition:   Return in about 6 months (around 09/16/2015) for Follow-up.  Neysa Hotter, MD  03/17/2015        The billing code submitted in association with this evaluation also includes the time to review patient's prior records in the [facility] system, communicate with the physician team and nursing, and to obtain corroborating data and discuss the risk and benefits of the proposed management plan with the patient and/or their family.~60 minutes for the aforementioned with > 50% of the time spent in direct face to face counseling and discussion of the issues as noted above.      Note: This report was transcribed using voice recognition software and is thus   prone to syntax and contextual spelling errors. Please do not hesitate to call   me if anything needs clarification or a dictated addendum

## 2015-03-17 NOTE — Patient Instructions (Signed)
Lung Cancer: Care Instructions  Your Care Instructions  Lung cancer occurs when abnormal cells grow out of control in the lung. Lung cancer can start anywhere in the lungs and spread to other parts of the body.  Treatment for lung cancer depends on what type of lung cancer you have and how advanced it is. Treatment may include surgery to remove the cancer. It could also include medicines (chemotherapy) or radiation to destroy cancer cells.  Being treated for cancer can weaken your body, and you may feel very tired. Home treatment and certain medicines can help you feel better.  Finding out that you have cancer is scary. You may feel many emotions and may need some help coping. Seek out family, friends, and counselors for support. You also can do things at home to make yourself feel better while you go through treatment. Call the [facility] [phone]) or visit its website at [email or web address] for more information.  Follow-up care is a key part of your treatment and safety. Be sure to make and go to all appointments, and call your doctor if you are having problems. It's also a good idea to know your test results and keep a list of the medicines you take.  How can you care for yourself at home?  ?? Take your medicines exactly as prescribed. Call your doctor if you think you are having a problem with your medicine. You will get more details on the specific medicines your doctor prescribes.  ?? Follow your doctor's instructions to relieve pain. Use pain medicine when you first feel pain, before it becomes severe. Taking pain medicines regularly is often the best way to keep pain under control.  ?? Eat healthy food. If you do not feel like eating, try to eat food that has protein and extra calories to keep up your strength and prevent weight loss. Drink liquid meal replacements for extra calories and protein. Try to eat your main meal early. Eating smaller portions more often may help as well.   ?? Get some physical activity every day, but do not get too tired. Keep doing the hobbies you enjoy as your energy allows.  ?? Do not smoke. Smoking can make your cancer symptoms worse. If you need help quitting, talk to your doctor about stop-smoking programs and medicines. These can increase your chances of quitting for good.  ?? If you use oxygen, do not smoke, light a cigarette, or use a flame while your oxygen is on. Smoking while using oxygen can lead to fire and even explosion.  ?? If you have nausea, try to eat several small meals a day. When you feel better, eat clear soups and mild foods until all symptoms are gone for 12 to 48 hours. Other good choices include dry toast, crackers, cooked cereal, and gelatin dessert, such as Jell-O.  ?? If you are vomiting or have diarrhea:  ?? Drink plenty of fluids (enough so that your urine is light yellow or clear like water) to prevent dehydration. Choose water and other caffeine-free clear liquids. If you have kidney, heart, or liver disease and have to limit fluids, talk with your doctor before you increase the amount of fluids you drink.  ?? When you are able to eat, try clear soups, mild foods, and liquids until all symptoms are gone for 12 to 48 hours. Other good choices include dry toast, crackers, cooked cereal, and gelatin dessert, such as Jell-O.  ?? Take steps to control your stress and workload. Learn  relaxation techniques.  ?? Share your feelings. Stress and tension affect our emotions. By expressing your feelings to others, you may be able to understand and cope with them.  ?? Consider joining a support group. Talking about a problem with your spouse, a good friend, or other people with similar problems is a good way to reduce tension and stress.  ?? Express yourself with art. Try writing, crafts, dance, or art to relieve stress. Some dance, writing, or art groups may be available just for people who have cancer.   ?? Be kind to your body and mind. Getting enough sleep, eating a healthy diet, and taking time to do things you enjoy can contribute to an overall feeling of balance in your life and help reduce stress.  ?? Get help if you need it. Discuss your concerns with your doctor or counselor.  ?? If you have not already done so, prepare a list of advance directives. Advance directives are instructions to your doctor and family members about what kind of care you want if you become unable to speak or express yourself.  When should you call for help?  Call 911 anytime you think you may need emergency care. For example, call if:  ?? You have sudden or severe chest pain.  ?? You have severe trouble breathing.  ?? You cough up a lot of blood.  ?? You vomit and feel like you may faint when you sit up or stand.  Call your doctor now or seek immediate medical care if:  ?? You are short of breath.  ?? You have new chest pain.  ?? You have a fever.  ?? Your neck and face swell.  ?? You have unexpected or severe nausea or vomiting.  ?? Your pain is not controlled by medicine.  ?? Your symptoms get worse or are not getting better.  Watch closely for changes in your health, and be sure to contact your doctor if:  ?? You develop a new cough, or your cough does not go away.  ?? You cough up yellow or green mucus for longer than 2 days.  ?? You are constipated or have diarrhea.   Where can you learn more?   Go to [employer].cy  Enter D847 in the search box to learn more about "Lung Cancer: Care Instructions."   ?? 2006-2015 [employer]. Care instructions adapted under license by [employer] (which disclaims liability or warranty for this information). This care instruction is for use with your licensed healthcare professional. If you have questions about a medical condition or this instruction, always ask your healthcare professional. [employer] any warranty or liability for your use of this  information.  Content Version: 10.7.482551; Current as of: August 04, 2014

## 2015-03-20 ENCOUNTER — Telehealth

## 2015-03-20 MED ORDER — ROSUVASTATIN 10 MG TAB
10 mg | ORAL_TABLET | Freq: Every evening | ORAL | Status: DC
Start: 2015-03-20 — End: 2015-06-01

## 2015-03-20 NOTE — Telephone Encounter (Signed)
Left message with Bobby Guzman that the CT scan of the chest is scheduled for Thurs, 4/7 arrive at [facility] at 11:30. NPO after 9:30 am. Advised that if he takes insulin in the morning that he must eat breakfast before 9:30 am, otherwise hold insulin and anti-diabetic meds. Will cancel CT abd and pelvis as his insurance denied. OK'd by Dr. Ellwood Dense.  Auth for CT Chest [id number]. Requested that Kosisochukwu call me to verify that he received this message.

## 2015-03-20 NOTE — Progress Notes (Signed)
[employer] approved exp 12/16/2015 son advised

## 2015-03-22 ENCOUNTER — Encounter: Attending: Surgery | Primary: Internal Medicine

## 2015-03-22 NOTE — Telephone Encounter (Signed)
I spoke with Bobby Guzman. Reviewed CT scan instructions. He will come tomorrow for the scan.

## 2015-03-23 ENCOUNTER — Encounter

## 2015-03-23 ENCOUNTER — Inpatient Hospital Stay: Admit: 2015-03-23 | Payer: PRIVATE HEALTH INSURANCE | Attending: Medical Oncology | Primary: Internal Medicine

## 2015-03-23 ENCOUNTER — Encounter: Primary: Internal Medicine

## 2015-03-23 DIAGNOSIS — Z08 Encounter for follow-up examination after completed treatment for malignant neoplasm: Secondary | ICD-10-CM

## 2015-03-23 LAB — CREATININE: Creatinine: 1.06 mg/dL (ref 0.70–1.30)

## 2015-03-23 LAB — BUN: BUN: 16 mg/dL (ref 7–18)

## 2015-03-23 MED ORDER — IOPAMIDOL 61 % IV SOLN
300 mg iodine /mL (61 %) | Freq: Once | INTRAVENOUS | Status: DC
Start: 2015-03-23 — End: 2015-03-23

## 2015-03-23 NOTE — Telephone Encounter (Signed)
PATIENT HASN'T HEARD FROM DR. ADAM'S YET AND HE IS LEAVING FOR [place] ON Saturday. HE WANTS TO KNOW WHAT'S GOING ON WITH PENDING PROCEDURE. PLEASE CALL HIM. THANKS!

## 2015-03-23 NOTE — Telephone Encounter (Signed)
PATIENT HASN'T HEARD FROM DR. ADAMS AS OF YET. Bobby Guzman IS LEAVING FOR [place] ON Saturday. HE WANTS TO KNOW WHAT'S GOING ON WITH PENDING PROCEDURE. PLEASE CALL PATIENT @ : [phone]. THANKS!

## 2015-06-01 MED ORDER — CANDESARTAN 4 MG TAB
4 mg | ORAL_TABLET | Freq: Every day | ORAL | Status: DC
Start: 2015-06-01 — End: 2015-10-17

## 2015-06-01 MED ORDER — FUROSEMIDE 20 MG TAB
20 mg | ORAL_TABLET | Freq: Every day | ORAL | Status: DC
Start: 2015-06-01 — End: 2015-11-23

## 2015-06-01 MED ORDER — ROSUVASTATIN 10 MG TAB
10 mg | ORAL_TABLET | Freq: Every evening | ORAL | Status: DC
Start: 2015-06-01 — End: 2015-06-27

## 2015-06-27 MED ORDER — ROSUVASTATIN 10 MG TAB
10 mg | ORAL_TABLET | Freq: Every evening | ORAL | Status: DC
Start: 2015-06-27 — End: 2015-10-17

## 2015-10-16 ENCOUNTER — Encounter: Attending: Cardiovascular Disease | Primary: Internal Medicine

## 2015-10-17 ENCOUNTER — Ambulatory Visit
Admit: 2015-10-17 | Discharge: 2015-10-17 | Payer: PRIVATE HEALTH INSURANCE | Attending: Medical Oncology | Primary: Internal Medicine

## 2015-10-17 ENCOUNTER — Encounter: Attending: Medical Oncology | Primary: Internal Medicine

## 2015-10-17 ENCOUNTER — Ambulatory Visit
Admit: 2015-10-17 | Discharge: 2015-10-17 | Payer: PRIVATE HEALTH INSURANCE | Attending: Cardiovascular Disease | Primary: Internal Medicine

## 2015-10-17 DIAGNOSIS — R0602 Shortness of breath: Secondary | ICD-10-CM

## 2015-10-17 DIAGNOSIS — C3412 Malignant neoplasm of upper lobe, left bronchus or lung: Secondary | ICD-10-CM

## 2015-10-17 MED ORDER — ROSUVASTATIN 10 MG TAB
10 mg | ORAL_TABLET | Freq: Every evening | ORAL | 1 refills | Status: DC
Start: 2015-10-17 — End: 2015-11-30

## 2015-10-17 MED ORDER — CANDESARTAN 4 MG TAB
4 mg | ORAL_TABLET | Freq: Every day | ORAL | 1 refills | Status: DC
Start: 2015-10-17 — End: 2015-11-30

## 2015-10-17 NOTE — Progress Notes (Signed)
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 The patient had CT scan on 03/23/15 as advised.      HISTORY OF PRESENT ILLNESS:  Mr. 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 The patient had CT scan on 03/23/15 as advised. He has not had the mediport removed as advised. He states he was in [place] and was worked up for a GI Bleed, possible ulcer and had 18 polyps removed during a colonoscopy. He states he was told that the polyps were "almost cancer". I have requested a translated copy of the records. He states he had lost weigh as advised but then gained it back in [place] while he was away on Business. He plans to follow up with his Cardiac Surgeons in [facility] and will discuss removal of the mediport. He has some dyspnea on exertion and an occasional cough. He denies any hemoptysis.    As noted on 03/17/15:  "The patient went for routine checkup and was found to have an abnormality in his left lung on chest x-ray.  This was followed by a CAT scan and  further workup leading to surgical resection in [place].  The original pathology report is in  [place].  I have reviewed the translation and the patient had a moderately to well-differentiated squamous cell carcinoma in the left upper lobe excision specimen.  39 lymph nodes were negative for any evidence of metastatic spread: Stage IIB (pT3,pN0,M0).  The patient was not recommended any further adjuvant treatment in [place] but was seen in Medical Oncology consultation in [place] and was advised to have adjuvant chemotherapy.  He was seen in [place] by Dr. Charlott Holler who also recommended adjuvant chemotherapy with Navelbine and Cisplatin as per NCCN guidelines.  This was started on now 09/29/2010 and according to Dr. Ansel Bong notes he had an elevated pretreatment CA-125 at 53 which eventually decreased to normal.  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  He had an inflamed anal fissure.  The patient travels back and forth between the [place] and [place] on business.  He still has a MediPort which he states he gets flushed once a year.  He has been recommended to have the MediPort removed but informed me that he was waiting for 5 years to be up before he would remove the MediPort.  He denies any fevers or chills.  The patient has surveillance scans every 6 months and is due for a CAT scan of the chest abdomen and pelvis which was last done in [place] in September 2015 and in [place] in  January 2015.    The patient denies any allergies.  He smoked at least 1-1/2-2 packs per day for several years and quit smoking 5 years ago.  He has a history of diabetes mellitus and is on insulin.  He has a history of hypertension and takes Atacand.  He developed mitral valve regurgitation following the episode of endocarditis and has symptoms of dyspnea on exertion.  He is  overweight since he stopped smoking and is trying to lose weight.  He has seen Dr. Ida Guzman  from Cardiology and has been recommended to have mitral valve surgery which he plans to have this Fall.  He has a history of ulcerative colitis which has not been a problem since he had chemotherapy.  He has a history of sleep apnea and is known to have elevated lipids.  The patient was born in [place] and has lived in the [place] for the past 25 years.  He states that one half-sister died of some kind of cancer but he is unsure as as to the details.  He has 2 sons who are in good health.    His medications were reviewed."    DX   Encounter Diagnosis   Name Primary?   ??? Primary malignant neoplasm of left upper lobe of lung ([place]) Yes        Past Medical History   Diagnosis Date   ??? Diabetes ([place])    ??? 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 Essential hypertension    ??? Lung cancer ([place]) 2011     s/p LUL resection and chemo   ??? Primary malignant neoplasm of left upper lobe of lung ([place])    ??? Sleep apnea       Past Surgical History   Procedure Laterality Date   ??? Hx other surgical  2011     LUL resection      Current Outpatient Prescriptions   Medication Sig   ??? rosuvastatin (CRESTOR) 10 mg tablet Take 1 Tab by mouth nightly.   ??? candesartan (ATACAND) 4 mg tablet Take 1 Tab by mouth daily.   ??? furosemide (LASIX) 20 mg tablet Take 1 Tab by mouth daily.   ??? sub-q insulin device, 40 unit devi by Does Not Apply route.     No current facility-administered medications for this visit.       No Known Allergies   Social History   Substance Use Topics   ??? Smoking status: Former Smoker     Packs/day: 2.00     Years: 34.00     Types: Cigarettes     Quit date: 02/20/2011   ??? Smokeless tobacco: Never Used      Comment: e- cig no nicotine   ??? Alcohol use 0.0 oz/week     0 Standard drinks or equivalent per week      Comment: 2shots or beers /week      Family History   Problem Relation Age of Onset   ??? Stroke Mother     ??? Heart Attack Father    ??? High Cholesterol Sister    ??? Hypertension Sister    ??? High Cholesterol Brother    ??? Hypertension Brother         Review of Systems:  A detailed 10 organ review of systems is obtained with pertinent positives as listed in the History of Present Illness and Past Medical History. All others are negative.    Physical Exam:  Visit Vitals   ??? BP 124/82   ??? Pulse 96   ??? Temp 97.8 ??F (36.6 ??C) (Oral)   ??? Ht '5\' 8"'$  (1.727 m)   ??? Wt 265 lb (120.2 kg)   ??? BMI 40.29 kg/m2       General appearance  WD, WN Caucasian male, alert, cooperative, no distress, appears stated age   Head  Normocephalic, without obvious abnormality, atraumatic   Eyes  PERRLA   Nose Nares normal. Septum midline. Mucosa normal. No drainage or sinus tenderness.   Throat Lips, mucosa, and tongue normal. Teeth and gums normal.  Oropharynx clear.   Neck supple, symmetrical, trachea midline, no adenopathy, thyroid: not enlarged, symmetric, no tenderness/mass/nodules,no JVD   Back   No midline or CVA tenderness   Lungs   clear to auscultation bilaterally   Heart  regular rate and rhythm, S1, S2 normal, note IV/VI holosystolic at apex    Abdomen   soft, obese, non-tender, non-distended. Bowel sounds normal. No masses,  No organomegaly   Extremities extremities normal, atraumatic, no cyanosis or edema   Skin Skin color, texture, turgor normal. No rashes or lesions   Lymph nodes Nonpalpable   Neurologic Nonfocal       Labs:   No results found for this or any previous visit (from the past 24 hour(s)).        Impression and Plan:  The patient appears to be in remission with regards to his Stage II B (pT3,pN0,M0) Squamous cell carcinoma of the Left Upper Lobe s/p Left Upper Lobectomy in July 2011 followed by adjuvant chemotherapy with Navelbine and Cisplatin which was complicated by endocarditis and development of Mitral regurgitation.    He still has a mediport. He did not want to have the mediport flushed  today. I have strongly advised him again to have the Mediport removed. Previously, he was advised to see Dr. Caren Hazy.    NCCN surveillance guidelines were reviewed. He will have a CT scan of the Chest, abdomen and pelvis next April 2017.     I have again recommended that he lose weight and modify his diet.    He was advised to see a Copywriter, advertising and he stated he may do so in [place] since he is traveling a lot on Business.      ICD-10-CM ICD-9-CM    1. Primary malignant neoplasm of left upper lobe of lung (HCC) C34.12 162.3      Follow-up Disposition:  Return in about 6 months (around 04/15/2016) for Follow-up.  Neysa Hotter, MD  10/18/2015

## 2015-10-17 NOTE — Progress Notes (Signed)
10/17/2015     Patient:  Bobby Guzman   DOB:  1961-07-21       CHIEF COMPLAINT:   Chief Complaint   Patient presents with   ??? Hypertension     on a new medication ( Bisoprololum 5 mg bid)   ??? Shortness of Breath         HISTORY OF PRESENT ILLNESS: Bobby Guzman presents for cardiology follow up. DOE unchanged. Occasional mild dizziness; denies syncope. Denies CP or palpitations.    PAST MEDICAL HISTORY:  Past Medical History   Diagnosis Date   ??? Diabetes ([place])    ??? 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 Essential hypertension    ??? Lung cancer ([place]) 2011     s/p LUL resection and chemo   ??? Primary malignant neoplasm of left upper lobe of lung ([place])    ??? Sleep apnea         PAST SURGICAL HISTORY:  Past Surgical History   Procedure Laterality Date   ??? Hx other surgical  2011     LUL resection       MEDICATIONS:  Current Outpatient Prescriptions   Medication Sig Dispense Refill   ??? rosuvastatin (CRESTOR) 10 mg tablet Take 1 Tab by mouth nightly. 90 Tab 1   ??? candesartan (ATACAND) 4 mg tablet Take 1 Tab by mouth daily. 90 Tab 1   ??? furosemide (LASIX) 20 mg tablet Take 1 Tab by mouth daily. 90 Tab 1   ??? sub-q insulin device, 40 unit devi by Does Not Apply route.      **also taking [place] BB (? Equivalent of bisoprolol)    ALLERGIES:   No Known Allergies    SOCIAL HISTORY:  Social History     Social History   ??? Marital status: MARRIED     Spouse name: N/A   ??? Number of children: N/A   ??? Years of education: N/A     Social History Main Topics   ??? Smoking status: Former Smoker     Packs/day: 2.00     Years: 34.00     Types: Cigarettes     Quit date: 02/20/2011   ??? Smokeless tobacco: Never Used      Comment: e- cig no nicotine   ??? Alcohol use 0.0 oz/week     0 Standard drinks or equivalent per week      Comment: 2shots or beers /week   ??? Drug use: No   ??? Sexual activity: Not Asked     Other Topics Concern   ??? None     Social History Narrative         FAMILY HISTORY:  Family History    Problem Relation Age of Onset   ??? Stroke Mother    ??? Heart Attack Father    ??? High Cholesterol Sister    ??? Hypertension Sister    ??? High Cholesterol Brother    ??? Hypertension Brother        REVIEW OF SYSTEMS:  Neuro: No headache, focal weakness/numbness, parasthesias  Eyes: No visual disturbance  ENMT: No soar throat, nasal congestion, hearing loss, tinnitus  CV: No chest pain, palpitations, syncope, claudication  Resp: dyspnea  GI: No abdominal pain, N/V, diarrhea, constipation  GU: No hematuria, dysuria  Skin: No rash  Heme: No bleeding, bruising  Constitutional: No fatigue, weakness, fever, weight change  Musculoskeletal: No myalgias, arthralgias  Other:    PHYSICAL EXAM:  Blood  pressure 130/90, pulse 88, height '5\' 8"'$  (1.727 m), weight 262 lb (118.8 kg), SpO2 99 %.   General: Well developed, well nourished, no acute distress, appears stated age  Eyes: Anicteric, no hemorrhage, normal appearing conjunctivae and lids  ENMT: Normal externally  Neck: Supple, no JVD, no carotid bruit  CV: RRR, normal S1S2, 3/6 HSM apex  Lungs: CTA bilaterally, normal respiratory excursion  Abd: Soft, NT, obese  Extremities: tr pedal edema  Skin: Warm and dry  Musculoskeletal: Normal muscle tone, normal gait  Neuro: Nonfocal exam, no rigidity/tremor  Psych: Alert and oriented, appropriate affect      LABS AND TESTING:  ?? EKG: NSR, PRWP, no ST/T changes  ?? TEE 01/24/11 - nl LVEF, vegetations on both MV leaflets, moderate MR  ?? Echo 02/24/15 - nl LV/RV size/fxn, moderate ASH, severe LAE, prob moderate MR  ?? ABI/PVR 03/06/15 - nl  ?? TEE 03/07/15 - nl LV/RV size/fxn, anterior MV leaflet with rupture, severe MR    ASSESSMENT AND PLAN:    1. SOB, hx endocarditis, severe MR - SOB likely multifactorial and in large part due to MR (in addition to lung dz). 6 months ago I had recommended MVR and have referred him to Dr. Andree Elk at [facility] (discussed case with surgeon and fed-ex'ed TEE disc at the time), however,  he refused stating he needed to urgently travel to [place] to address some business affairs. He has returned and has scheduled an office visit with Dr. Andree Elk on 10/25/15. Will followup the results of this upcoming visit and help coordinate any further testing, etc (ie preop cath, TEE, etc). Continue current cardiac meds.  2. HTN - Blood pressure is mildly elevated today. Continue current antihypertensive regimen.  3. Dyslipidemia - Recent lipids unknown. Continue current medications and follow up labs.    Biagio Quint, MD

## 2015-10-17 NOTE — Progress Notes (Signed)
Pt has no complaints. States he has valve problems and is scheduled to meet with Dr. Andree Elk in [place]. [place]. Pt saw primary, Dr. Hanley Seamen, 6 months ago. Pt had CT scan 03/23/2015. EKG 02/16/15. Pt still has mediport. Pt seen by Dr. Ellwood Dense. Pt reports SOB and coughing. Pt reports that while in [place] two months ago he had a colonoscopy where 18 polyps were removed, pt advised to see an GI doctor. Pt advised to loose weight and have mediport removed. Return in six months.

## 2015-10-17 NOTE — Patient Instructions (Addendum)
Lung Cancer: Care Instructions  Your Care Instructions  Lung cancer occurs when abnormal cells grow out of control in the lung. Lung cancer can start anywhere in the lungs and spread to other parts of the body.  Treatment for lung cancer depends on what type of lung cancer you have and how advanced it is. Treatment may include surgery to remove the cancer. It could also include medicines (chemotherapy) or radiation to destroy cancer cells.  Being treated for cancer can weaken your body, and you may feel very tired. Home treatment and certain medicines can help you feel better.  Finding out that you have cancer is scary. You may feel many emotions and may need some help coping. Seek out family, friends, and counselors for support. You also can do things at home to make yourself feel better while you go through treatment. Call the [facility] [phone]) or visit its website at [facility] for more information.  Follow-up care is a key part of your treatment and safety. Be sure to make and go to all appointments, and call your doctor if you are having problems. It's also a good idea to know your test results and keep a list of the medicines you take.  How can you care for yourself at home?  ?? Take your medicines exactly as prescribed. Call your doctor if you think you are having a problem with your medicine. You will get more details on the specific medicines your doctor prescribes.  ?? Follow your doctor's instructions to relieve pain. Use pain medicine when you first feel pain, before it becomes severe. Taking pain medicines regularly is often the best way to keep pain under control.  ?? Eat healthy food. If you do not feel like eating, try to eat food that has protein and extra calories to keep up your strength and prevent weight loss. Drink liquid meal replacements for extra calories and protein. Try to eat your main meal early. Eating smaller portions more often may help as well.   ?? Get some physical activity every day, but do not get too tired. Keep doing the hobbies you enjoy as your energy allows.  ?? Do not smoke. Smoking can make your cancer symptoms worse. If you need help quitting, talk to your doctor about stop-smoking programs and medicines. These can increase your chances of quitting for good.  ?? If you use oxygen, do not smoke, light a cigarette, or use a flame while your oxygen is on. Smoking while using oxygen can lead to fire and even explosion.  ?? If you have nausea, try to eat several small meals a day. When you feel better, eat clear soups and mild foods until all symptoms are gone for 12 to 48 hours. Other good choices include dry toast, crackers, cooked cereal, and gelatin dessert, such as Jell-O.  ?? If you are vomiting or have diarrhea:  ?? Drink plenty of fluids (enough so that your urine is light yellow or clear like water) to prevent dehydration. Choose water and other caffeine-free clear liquids. If you have kidney, heart, or liver disease and have to limit fluids, talk with your doctor before you increase the amount of fluids you drink.  ?? When you are able to eat, try clear soups, mild foods, and liquids until all symptoms are gone for 12 to 48 hours. Other good choices include dry toast, crackers, cooked cereal, and gelatin dessert, such as Jell-O.  ?? Take steps to control your stress and workload. Learn  relaxation techniques.  ?? Share your feelings. Stress and tension affect our emotions. By expressing your feelings to others, you may be able to understand and cope with them.  ?? Consider joining a support group. Talking about a problem with your spouse, a good friend, or other people with similar problems is a good way to reduce tension and stress.  ?? Express yourself with art. Try writing, crafts, dance, or art to relieve stress. Some dance, writing, or art groups may be available just for people who have cancer.   ?? Be kind to your body and mind. Getting enough sleep, eating a healthy diet, and taking time to do things you enjoy can contribute to an overall feeling of balance in your life and help reduce stress.  ?? Get help if you need it. Discuss your concerns with your doctor or counselor.  ?? If you have not already done so, prepare a list of advance directives. Advance directives are instructions to your doctor and family members about what kind of care you want if you become unable to speak or express yourself.  When should you call for help?  Call 911 anytime you think you may need emergency care. For example, call if:  ?? You have sudden or severe chest pain.  ?? You have severe trouble breathing.  ?? You cough up a lot of blood.  ?? You vomit and feel like you may faint when you sit up or stand.  Call your doctor now or seek immediate medical care if:  ?? You are short of breath.  ?? You have new chest pain.  ?? You have a fever.  ?? Your neck and face swell.  ?? You have unexpected or severe nausea or vomiting.  ?? Your pain is not controlled by medicine.  ?? Your symptoms get worse or are not getting better.  Watch closely for changes in your health, and be sure to contact your doctor if:  ?? You develop a new cough, or your cough does not go away.  ?? You cough up yellow or green mucus for longer than 2 days.  ?? You are constipated or have diarrhea.  Where can you learn more?  Go to [facility]  Enter D847 in the search box to learn more about "Lung Cancer: Care Instructions."  ?? 2006-2016 Healthwise, Inc[employer]. Care instructions adapted under license by [employer] (which disclaims liability or warranty for this information). This care instruction is for use with your licensed healthcare professional. If you have questions about a medical condition or this instruction, always ask your healthcare professional. [place] any warranty or liability for your use of this information.  Content Version: 11.0.578772; Current as of: November 04, 2014

## 2015-10-27 ENCOUNTER — Ambulatory Visit
Admit: 2015-10-27 | Discharge: 2015-10-27 | Payer: PRIVATE HEALTH INSURANCE | Attending: Cardiovascular Disease | Primary: Internal Medicine

## 2015-10-27 DIAGNOSIS — R0602 Shortness of breath: Secondary | ICD-10-CM

## 2015-10-27 NOTE — H&P (View-Only) (Signed)
10/27/2015     Patient:  Bobby Guzman   DOB:  Dec 21, 1960       CHIEF COMPLAINT:   Chief Complaint   Patient presents with   ??? Valvular Heart Disease         HISTORY OF PRESENT ILLNESS: Bobby Guzman presents for cardiology follow up. Significant DOE unchanged. Denies CP, dizziness, palps.    PAST MEDICAL HISTORY:  Past Medical History   Diagnosis Date   ??? Diabetes ([place])    ??? 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 Essential hypertension    ??? Lung cancer ([facility]) 2011     s/p LUL resection and chemo   ??? Primary malignant neoplasm of left upper lobe of lung ([place])    ??? Sleep apnea         PAST SURGICAL HISTORY:  Past Surgical History   Procedure Laterality Date   ??? Hx other surgical  2011     LUL resection       MEDICATIONS:  Current Outpatient Prescriptions   Medication Sig Dispense Refill   ??? VICTOZA 3-PAK 0.6 mg/0.1 mL (18 mg/3 mL) sub-q pen 1.8 DAILY 90 DAYS SUPPLY DX:250.62  1   ??? metFORMIN (GLUCOPHAGE) 1,000 mg tablet TAKE ONE TAB BY MOUTH TWICE A DAY  5   ??? rosuvastatin (CRESTOR) 10 mg tablet Take 1 Tab by mouth nightly. 90 Tab 1   ??? candesartan (ATACAND) 4 mg tablet Take 1 Tab by mouth daily. 90 Tab 1   ??? furosemide (LASIX) 20 mg tablet Take 1 Tab by mouth daily. 90 Tab 1   ??? sub-q insulin device, 40 unit devi by Does Not Apply route.      **Also taking [place] bisoprolol equivalent    ALLERGIES:   No Known Allergies    SOCIAL HISTORY:  Social History     Social History   ??? Marital status: MARRIED     Spouse name: N/A   ??? Number of children: N/A   ??? Years of education: N/A     Social History Main Topics   ??? Smoking status: Former Smoker     Packs/day: 2.00     Years: 34.00     Types: Cigarettes     Quit date: 02/20/2011   ??? Smokeless tobacco: Never Used      Comment: e- cig no nicotine   ??? Alcohol use 0.0 oz/week     0 Standard drinks or equivalent per week      Comment: 2shots or beers /week   ??? Drug use: No   ??? Sexual activity: Not Asked     Other Topics Concern   ??? None      Social History Narrative         FAMILY HISTORY:  Family History   Problem Relation Age of Onset   ??? Stroke Mother    ??? Heart Attack Father    ??? High Cholesterol Sister    ??? Hypertension Sister    ??? High Cholesterol Brother    ??? Hypertension Brother        REVIEW OF SYSTEMS:  Neuro: No headache, focal weakness/numbness, parasthesias  Eyes: No visual disturbance  ENMT: No soar throat, nasal congestion, hearing loss, tinnitus  CV: No chest pain, palpitations, dizziness, syncope, claudication  Resp: dyspnea  GI: No abdominal pain, N/V, diarrhea, constipation  GU: No hematuria, dysuria  Skin: No rash  Heme: No bleeding, bruising  Constitutional: No fatigue, weakness, fever,  weight change  Musculoskeletal: No myalgias, arthralgias  Other:    PHYSICAL EXAM:  Blood pressure 124/86, pulse 95, height '5\' 8"'$  (1.727 m), weight 260 lb (117.9 kg), SpO2 98 %.   General: Well developed, well nourished, no acute distress, appears stated age  Eyes: Anicteric, no hemorrhage, normal appearing conjunctivae and lids  ENMT: Normal externally  Neck: Supple, no JVD, no carotid bruit  CV: RRR, normal S1S2, 3/6 HSM LLSB  Lungs: CTA bilaterally, normal respiratory excursion  Abd: Soft, NT, obese  Extremities: tr pedal edema  Skin: Warm and dry  Musculoskeletal: Normal muscle tone, normal gait  Neuro: Nonfocal exam, no rigidity/tremor  Psych: Alert and oriented, appropriate affect      LABS AND TESTING:  ?? TEE 01/24/11 - nl LVEF, vegetations on both MV leaflets, moderate MR  ?? Echo 02/24/15 - nl LV/RV size/fxn, moderate ASH, severe LAE, prob moderate MR  ?? ABI/PVR 03/06/15 - nl  ?? TEE 03/07/15 - nl LV/RV size/fxn, anterior MV leaflet with rupture, severe MR    ASSESSMENT AND PLAN:    1. SOB, hx endocarditis, severe MR - SOB likely multifactorial and in large part due to MR (in addition to lung dz). 6 months ago I had recommended MVR and have referred him to Dr. Andree Elk at [facility]  (discussed case with surgeon and fed-ex'ed TEE disc at the time), however, he refused stating he needed to urgently travel to [place] to address some business affairs. Upon his return, he has seen Dr. Andree Elk in consultation and is scheduled for MV repair 11/16/15 at [facility]. As discussed with Dr. Andree Elk' office, will arrange for [facility] preoperatively here and send disc/reports. Continue current cardiac meds.  2. HTN - Blood pressure is mildly elevated today. Continue current antihypertensive regimen.  3. Dyslipidemia - Recent lipids unknown. Continue current medications and follow up labs.    Biagio Quint, MD

## 2015-10-27 NOTE — Progress Notes (Signed)
10/27/2015     Patient:  Bobby Guzman   DOB:  26-May-1961       CHIEF COMPLAINT:   Chief Complaint   Patient presents with   ??? Valvular Heart Disease         HISTORY OF PRESENT ILLNESS: Josie Ruehl presents for cardiology follow up. Significant DOE unchanged. Denies CP, dizziness, palps.    PAST MEDICAL HISTORY:  Past Medical History   Diagnosis Date   ??? Diabetes ([place])    ??? 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 Essential hypertension    ??? Lung cancer ([place]) 2011     s/p LUL resection and chemo   ??? Primary malignant neoplasm of left upper lobe of lung ([place])    ??? Sleep apnea         PAST SURGICAL HISTORY:  Past Surgical History   Procedure Laterality Date   ??? Hx other surgical  2011     LUL resection       MEDICATIONS:  Current Outpatient Prescriptions   Medication Sig Dispense Refill   ??? VICTOZA 3-PAK 0.6 mg/0.1 mL (18 mg/3 mL) sub-q pen 1.8 DAILY 90 DAYS SUPPLY DX:250.62  1   ??? metFORMIN (GLUCOPHAGE) 1,000 mg tablet TAKE ONE TAB BY MOUTH TWICE A DAY  5   ??? rosuvastatin (CRESTOR) 10 mg tablet Take 1 Tab by mouth nightly. 90 Tab 1   ??? candesartan (ATACAND) 4 mg tablet Take 1 Tab by mouth daily. 90 Tab 1   ??? furosemide (LASIX) 20 mg tablet Take 1 Tab by mouth daily. 90 Tab 1   ??? sub-q insulin device, 40 unit devi by Does Not Apply route.      **Also taking [place] bisoprolol equivalent    ALLERGIES:   No Known Allergies    SOCIAL HISTORY:  Social History     Social History   ??? Marital status: MARRIED     Spouse name: N/A   ??? Number of children: N/A   ??? Years of education: N/A     Social History Main Topics   ??? Smoking status: Former Smoker     Packs/day: 2.00     Years: 34.00     Types: Cigarettes     Quit date: 02/20/2011   ??? Smokeless tobacco: Never Used      Comment: e- cig no nicotine   ??? Alcohol use 0.0 oz/week     0 Standard drinks or equivalent per week      Comment: 2shots or beers /week   ??? Drug use: No   ??? Sexual activity: Not Asked     Other Topics Concern   ??? None      Social History Narrative         FAMILY HISTORY:  Family History   Problem Relation Age of Onset   ??? Stroke Mother    ??? Heart Attack Father    ??? High Cholesterol Sister    ??? Hypertension Sister    ??? High Cholesterol Brother    ??? Hypertension Brother        REVIEW OF SYSTEMS:  Neuro: No headache, focal weakness/numbness, parasthesias  Eyes: No visual disturbance  ENMT: No soar throat, nasal congestion, hearing loss, tinnitus  CV: No chest pain, palpitations, dizziness, syncope, claudication  Resp: dyspnea  GI: No abdominal pain, N/V, diarrhea, constipation  GU: No hematuria, dysuria  Skin: No rash  Heme: No bleeding, bruising  Constitutional: No fatigue, weakness, fever,  weight change  Musculoskeletal: No myalgias, arthralgias  Other:    PHYSICAL EXAM:  Blood pressure 124/86, pulse 95, height '5\' 8"'$  (1.727 m), weight 260 lb (117.9 kg), SpO2 98 %.   General: Well developed, well nourished, no acute distress, appears stated age  Eyes: Anicteric, no hemorrhage, normal appearing conjunctivae and lids  ENMT: Normal externally  Neck: Supple, no JVD, no carotid bruit  CV: RRR, normal S1S2, 3/6 HSM LLSB  Lungs: CTA bilaterally, normal respiratory excursion  Abd: Soft, NT, obese  Extremities: tr pedal edema  Skin: Warm and dry  Musculoskeletal: Normal muscle tone, normal gait  Neuro: Nonfocal exam, no rigidity/tremor  Psych: Alert and oriented, appropriate affect      LABS AND TESTING:  ?? TEE 01/24/11 - nl LVEF, vegetations on both MV leaflets, moderate MR  ?? Echo 02/24/15 - nl LV/RV size/fxn, moderate ASH, severe LAE, prob moderate MR  ?? ABI/PVR 03/06/15 - nl  ?? TEE 03/07/15 - nl LV/RV size/fxn, anterior MV leaflet with rupture, severe MR    ASSESSMENT AND PLAN:    1. SOB, hx endocarditis, severe MR - SOB likely multifactorial and in large part due to MR (in addition to lung dz). 6 months ago I had recommended MVR and have referred him to Dr. Andree Elk at [facility]  (discussed case with surgeon and fed-ex'ed TEE disc at the time), however, he refused stating he needed to urgently travel to [place] to address some business affairs. Upon his return, he has seen Dr. Andree Elk in consultation and is scheduled for MV repair 11/16/15 at [facility]. As discussed with Dr. Andree Elk' office, will arrange for [facility] preoperatively here and send disc/reports. Continue current cardiac meds.  2. HTN - Blood pressure is mildly elevated today. Continue current antihypertensive regimen.  3. Dyslipidemia - Recent lipids unknown. Continue current medications and follow up labs.    Biagio Quint, MD

## 2015-11-02 ENCOUNTER — Inpatient Hospital Stay
Admit: 2015-11-02 | Discharge: 2015-11-10 | Payer: PRIVATE HEALTH INSURANCE | Attending: Interventional Cardiology | Primary: Internal Medicine

## 2015-11-02 DIAGNOSIS — I340 Nonrheumatic mitral (valve) insufficiency: Secondary | ICD-10-CM

## 2015-11-02 LAB — CBC W/O DIFF
HCT: 44.6 % (ref 42.0–52.0)
HGB: 14.9 g/dL (ref 14.0–18.0)
MCH: 28.3 PG (ref 27.0–35.0)
MCHC: 33.4 g/dL (ref 30.7–37.3)
MCV: 84.8 FL (ref 81.0–94.0)
MPV: 9.8 FL (ref 9.2–11.8)
PLATELET: 273 10*3/uL (ref 130–400)
RBC: 5.26 M/uL (ref 4.70–6.00)
RDW: 14.6 % — ABNORMAL HIGH (ref 11.5–14.0)
WBC: 13.5 10*3/uL — ABNORMAL HIGH (ref 4.8–10.6)

## 2015-11-02 LAB — LIPID PANEL
CHOL/HDL Ratio: 4.9 — ABNORMAL HIGH (ref 0.0–4.5)
Cholesterol, total: 147 mg/dL (ref <200)
HDL Cholesterol: 30 mg/dL — ABNORMAL LOW (ref >40)
LDL, calculated: 82.4 MG/DL (ref <100)
Triglyceride: 173 mg/dL — ABNORMAL HIGH (ref <150)
VLDL, calculated: 34.6 MG/DL (ref <38)

## 2015-11-02 LAB — METABOLIC PANEL, COMPREHENSIVE
A-G Ratio: 0.7 (ref 0.7–2.8)
ALT (SGPT): 30 U/L (ref 13–61)
AST (SGOT): 22 U/L (ref 15–37)
Albumin: 3.7 g/dL (ref 3.5–4.7)
Alk. phosphatase: 101 U/L (ref 45–117)
Anion gap: 15 mmol/L (ref 10–20)
BUN: 25 mg/dL — ABNORMAL HIGH (ref 7–18)
Bilirubin, total: 0.3 mg/dL (ref 0.2–1.0)
CO2: 24 mmol/L (ref 21–32)
Calcium: 9.1 mg/dL (ref 8.5–10.1)
Chloride: 102 mmol/L (ref 98–107)
Creatinine: 1.3 mg/dL (ref 0.70–1.30)
GFR est AA: >60 mL/min/{1.73_m2} (ref >60)
GFR est non-AA: >60 mL/min/{1.73_m2} (ref >60)
Globulin: 5.1 g/dL — ABNORMAL HIGH (ref 1.7–4.7)
Glucose: 176 mg/dL — ABNORMAL HIGH (ref 74–106)
Potassium: 4.4 mmol/L (ref 3.5–5.1)
Protein, total: 8.8 g/dL — ABNORMAL HIGH (ref 6.4–8.2)
Sodium: 137 mmol/L (ref 136–145)

## 2015-11-02 LAB — PROTHROMBIN TIME + INR
INR: 1 (ref 0.8–1.2)
Prothrombin time: 10.2 s (ref 9.4–11.1)

## 2015-11-02 LAB — PTT: aPTT: 23.4 s (ref 21.0–28.0)

## 2015-11-02 LAB — HEMOGLOBIN A1C W/O EAG: Hemoglobin A1c: 7.4 % — ABNORMAL HIGH (ref 4.2–6.3)

## 2015-11-02 LAB — MICROALBUMIN, UR, RAND W/ MICROALB/CREAT RATIO
Creatinine, urine random: 132.38 mg/dL
Microalbumin,urine random: 1.72 MG/DL
Microalbumin/Creat ratio (mg/g creat): 13 mg/g (ref 0.0–29.9)

## 2015-11-02 LAB — T3, FREE: Triiodothyronine (T3), free: 2.5 PG/ML (ref 2.2–4.0)

## 2015-11-02 LAB — T4, FREE: T4, Free: 1 NG/DL (ref 0.8–1.5)

## 2015-11-02 LAB — TSH 3RD GENERATION: TSH: 1.87 u[IU]/mL (ref 0.360–3.740)

## 2015-11-02 MED ORDER — FENTANYL CITRATE (PF) 50 MCG/ML IJ SOLN
50 mcg/mL | INTRAMUSCULAR | Status: DC | PRN
Start: 2015-11-02 — End: 2015-11-02
  Administered 2015-11-02 (×2): via INTRAVENOUS

## 2015-11-02 MED ORDER — LIDOCAINE HCL 1 % (10 MG/ML) IJ SOLN
10 mg/mL (1 %) | Freq: Once | INTRAMUSCULAR | Status: AC
Start: 2015-11-02 — End: 2015-11-02
  Administered 2015-11-02: 15:00:00 via SUBCUTANEOUS

## 2015-11-02 MED ORDER — SODIUM CHLORIDE 0.9 % IJ SYRG
INTRAMUSCULAR | Status: DC | PRN
Start: 2015-11-02 — End: 2015-11-02

## 2015-11-02 MED ORDER — HEPARIN (PORCINE) 1,000 UNIT/ML IJ SOLN
1000 unit/mL | Freq: Once | INTRAMUSCULAR | Status: DC
Start: 2015-11-02 — End: 2015-11-02

## 2015-11-02 MED ORDER — SODIUM CHLORIDE 0.9 % IV PIGGY BACK
250 mg | INTRAVENOUS | Status: DC
Start: 2015-11-02 — End: 2015-11-02

## 2015-11-02 MED ORDER — HEPARIN (PORCINE) IN NS (PF) 1,000 UNIT/500 ML IV
1000 unit/500 mL | INTRAVENOUS | Status: DC | PRN
Start: 2015-11-02 — End: 2015-11-02
  Administered 2015-11-02: 15:00:00

## 2015-11-02 MED ORDER — ASPIRIN 325 MG TAB
325 mg | Freq: Once | ORAL | Status: AC
Start: 2015-11-02 — End: 2015-11-02
  Administered 2015-11-02: 14:00:00 via ORAL

## 2015-11-02 MED ORDER — IODIXANOL 320 MG/ML IV SOLN
320 mg iodine/mL | INTRAVENOUS | Status: DC | PRN
Start: 2015-11-02 — End: 2015-11-02
  Administered 2015-11-02: 15:00:00 via INTRA_ARTERIAL

## 2015-11-02 MED ORDER — NITROGLYCERIN IN D5W 200 MCG/ML IV
50 mg/2 mL (200 mcg/mL) | INTRAVENOUS | Status: DC
Start: 2015-11-02 — End: 2015-11-02

## 2015-11-02 MED ORDER — VERAPAMIL 2.5 MG/ML IV
2.5 mg/mL | Freq: Once | INTRAVENOUS | Status: DC
Start: 2015-11-02 — End: 2015-11-02

## 2015-11-02 MED ORDER — IODIXANOL 320 MG/ML IV SOLN
320 mg iodine/mL | INTRAVENOUS | Status: DC | PRN
Start: 2015-11-02 — End: 2015-11-02

## 2015-11-02 MED ORDER — HEPARIN (PORCINE) IN NS (PF) 2,000 UNIT/1,000 ML IV
2000 unit/1,000 mL | INTRAVENOUS | Status: DC | PRN
Start: 2015-11-02 — End: 2015-11-02
  Administered 2015-11-02: 15:00:00

## 2015-11-02 MED ORDER — BIVALIRUDIN (ANGIOMAX) 5 MG/ML BOLUS NC
5 mg/mL | Freq: Once | INTRAVENOUS | Status: DC
Start: 2015-11-02 — End: 2015-11-02

## 2015-11-02 MED ORDER — SODIUM CHLORIDE 0.9 % IJ SYRG
Freq: Three times a day (TID) | INTRAMUSCULAR | Status: DC
Start: 2015-11-02 — End: 2015-11-02

## 2015-11-02 MED ORDER — NITROGLYCERIN IN D5W 200 MCG/ML IV
50 mg/2 mL (200 mcg/mL) | Freq: Once | INTRAVENOUS | Status: DC
Start: 2015-11-02 — End: 2015-11-02

## 2015-11-02 MED ORDER — MIDAZOLAM 1 MG/ML IJ SOLN
1 mg/mL | INTRAMUSCULAR | Status: DC | PRN
Start: 2015-11-02 — End: 2015-11-02
  Administered 2015-11-02 (×2): via INTRAVENOUS

## 2015-11-02 MED ORDER — HEPARIN (PORCINE) 1,000 UNIT/ML IJ SOLN
1000 unit/mL | INTRAMUSCULAR | Status: DC | PRN
Start: 2015-11-02 — End: 2015-11-02

## 2015-11-02 MED ORDER — METFORMIN 1,000 MG TAB
1000 mg | ORAL_TABLET | ORAL | 0 refills | Status: DC
Start: 2015-11-02 — End: 2015-11-30

## 2015-11-02 MED ORDER — DIPHENHYDRAMINE 25 MG CAP
25 mg | Freq: Once | ORAL | Status: AC
Start: 2015-11-02 — End: 2015-11-02
  Administered 2015-11-02: 14:00:00 via ORAL

## 2015-11-02 MED ORDER — HEPARIN (PORCINE) IN NS (PF) 2,000 UNIT/1,000 ML IV
2000 unit/1,000 mL | Freq: Once | INTRAVENOUS | Status: DC
Start: 2015-11-02 — End: 2015-11-02

## 2015-11-02 MED ORDER — LIDOCAINE HCL 1 % (10 MG/ML) IJ SOLN
10 mg/mL (1 %) | Freq: Once | INTRAMUSCULAR | Status: DC
Start: 2015-11-02 — End: 2015-11-02

## 2015-11-02 MED ORDER — HEPARIN (PORCINE) IN NS (PF) 1,000 UNIT/500 ML IV
1000 unit/500 mL | Freq: Once | INTRAVENOUS | Status: DC
Start: 2015-11-02 — End: 2015-11-02

## 2015-11-02 MED FILL — MIDAZOLAM 1 MG/ML IJ SOLN: 1 mg/mL | INTRAMUSCULAR | Qty: 2

## 2015-11-02 MED FILL — VISIPAQUE 320 MG IODINE/ML INTRAVENOUS SOLUTION: 320 mg iodine/mL | INTRAVENOUS | Qty: 50

## 2015-11-02 MED FILL — HEPARIN (PORCINE) IN NS (PF) 2,000 UNIT/1,000 ML IV: 2000 unit/1,000 mL | INTRAVENOUS | Qty: 1000

## 2015-11-02 MED FILL — BD POSIFLUSH NORMAL SALINE 0.9 % INJECTION SYRINGE: INTRAMUSCULAR | Qty: 10

## 2015-11-02 MED FILL — DIPHENHYDRAMINE 25 MG CAP: 25 mg | ORAL | Qty: 1

## 2015-11-02 MED FILL — HEPARIN (PORCINE) 1,000 UNIT/ML IJ SOLN: 1000 unit/mL | INTRAMUSCULAR | Qty: 10

## 2015-11-02 MED FILL — HEPARIN (PORCINE) IN NS (PF) 1,000 UNIT/500 ML IV: 1000 unit/500 mL | INTRAVENOUS | Qty: 500

## 2015-11-02 MED FILL — NITROGLYCERIN IN D5W 200 MCG/ML IV: 50 mg/2 mL (200 mcg/mL) | INTRAVENOUS | Qty: 250

## 2015-11-02 MED FILL — VISIPAQUE 320 MG IODINE/ML INTRAVENOUS SOLUTION: 320 mg iodine/mL | INTRAVENOUS | Qty: 150

## 2015-11-02 MED FILL — LIDOCAINE HCL 1 % (10 MG/ML) IJ SOLN: 10 mg/mL (1 %) | INTRAMUSCULAR | Qty: 40

## 2015-11-02 MED FILL — FENTANYL CITRATE (PF) 50 MCG/ML IJ SOLN: 50 mcg/mL | INTRAMUSCULAR | Qty: 2

## 2015-11-02 MED FILL — ASPIRIN 325 MG TAB: 325 mg | ORAL | Qty: 1

## 2015-11-02 NOTE — Interval H&P Note (Signed)
H&P Update:  Bobby Guzman was seen and examined.  History and physical has been reviewed. The patient has been examined. There have been no significant clinical changes since the completion of the originally dated History and Physical.  Patient for left heart catheterization/ possible angioplasty/stent. Risks/benefits/alternatives discussed with patient (and son) including risks of bleeding, infection, aneurysm, blood clot at access site and risks of myocardial infarction, stroke, or kidney damage from IV dye. Pt also instructed in importance of dual anti-platelet therapy should stent be placed, and risk of sudden MI if these meds are omitted.Pt. Understands all and agrees to proceed. Consents signed.  Expected length of stay for this admission is <24 hrs    MP=3  ASA=3    (+)ALLEN's TEST    Plan is for moderate sedation      Signed By: Rolla Flatten, NP     November 02, 2015 9:31 AM

## 2015-11-02 NOTE — Brief Op Note (Signed)
BRIEF CARDIAC CATHETERIZION NOTE    Date of Procedure: 11/02/15  Preoperative Diagnosis: MR-severe  Postoperative Diagnosis: MR-severe w patent coronaries. CO 6.30 by Fick w mPAP 39/PCWP 26/LVEDP 21.    Anesthesia: Local/moderate sedation   Estimated Blood Loss: 10cc  Specimens: None   Findings: MR-severe w patent coronaries. CO 6.30 by Fick w mPAP 39/PCWP 26/LVEDP 21.  Complications: None  Implants: None

## 2015-11-02 NOTE — Other (Signed)
Pt awake and alert. Right groin venous sheeth pulled, manual pressure held for 5 min. Site clear, dry and intact. Groin site precautions reviewed with pt. vu

## 2015-11-02 NOTE — Progress Notes (Signed)
TRANSFER - OUT REPORT:    Verbal report given toJillanneRN(name) on Bobby Guzman  being transferred to [facility](unit) for routine progression of care       Report consisted of patient???s Situation, Background, Assessment and   Recommendations(SBAR).     Information from the following report(s) SBAR was reviewed with the receiving nurse.    Lines:   Peripheral IV 11/02/15 Left Antecubital (Active)   Site Assessment Clean, dry, & intact 11/02/2015  8:25 AM   Phlebitis Assessment 0 11/02/2015  8:25 AM   Infiltration Assessment 0 11/02/2015  8:25 AM   Dressing Status Clean, dry, & intact 11/02/2015  8:25 AM   Dressing Type Transparent 11/02/2015  8:25 AM        Opportunity for questions and clarification was provided.      Patient transported with:   Registered Nurse       Postprocedure time out complete

## 2015-11-02 NOTE — Progress Notes (Signed)
Precase time out complete. Temp and humity of room noted to be WNL.  Second Time OUT completed at 10:43 for cardiac cath

## 2015-11-02 NOTE — Other (Signed)
Pt son at bedside. Reviewed dc instructions again with son. Pt off bedrest, ambulated to BR and around unit.. Gait steady. Right groin site clear without any bleeding or hematoma. Iv dc'd. Pt to lobby with son via wheelchair

## 2015-11-02 NOTE — Progress Notes (Signed)
TRANSFER - IN REPORT:    Verbal report received from JillannRN(name) on Bobby Guzman  being received from [facility](unit) for routine progression of care      Report consisted of patient???s Situation, Background, Assessment and   Recommendations(SBAR).     Information from the following report(s) SBAR was reviewed with the receiving nurse.    Opportunity for questions and clarification was provided.      Assessment completed upon patient???s arrival to unit and care assumed.       Precase time out complete. Temp and humity of room noted to be WNL.

## 2015-11-02 NOTE — Consults (Signed)
11/02/2015     INTERVENTIONAL CARDIOLOGY/CARDIOLOGY CONSULT NOTE    Patient:  Bobby Guzman   DOB:  07/08/1961       CHIEF COMPLAINT: CHF    HISTORY OF PRESENT ILLNESS: 54 y.o. male with the below [place] presenting with c/o DOE that has been progressive over the last 2 years. Currently has SOB w minimal activity. No orthopnea/le edema or symptoms at rest. No CP. H/o MV endocarditis s/p tx w/ out recurrence or infectious symptoms at this time. S/p TEE revealing severe MR.    No prev h/o CAD/PCI/MI/CVA/PAD/PI.    PAST MEDICAL HISTORY:  Past Medical History   Diagnosis Date   ??? Diabetes ([place])    ??? 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 Essential hypertension    ??? Lung cancer ([place]) 2011     s/p LUL resection and chemo   ??? Primary malignant neoplasm of left upper lobe of lung ([place])    ??? Sleep apnea    ??? Ulcerative colitis ([place])         PAST SURGICAL HISTORY:  Past Surgical History   Procedure Laterality Date   ??? Hx other surgical  2011     LUL resection   ??? Pr chest surgery procedure unlisted       left upper lobectomy       MEDICATIONS:  Current Facility-Administered Medications   Medication Dose Route Frequency Provider Last Rate Last Dose   ??? sodium chloride (NS) flush 5-10 mL  5-10 mL IntraVENous Q8H Rolla Flatten, NP       ??? sodium chloride (NS) flush 5-10 mL  5-10 mL IntraVENous PRN Rolla Flatten, NP       ??? lidocaine (XYLOCAINE) 10 mg/mL (1 %) injection 20-30 mL  20-30 mL SubCUTAneous ONCE Rolla Flatten, NP       ??? heparinized saline 2 units/mL infusion 1,000 Units  500 mL IntraarTERial ONCE Rolla Flatten, NP       ??? heparin (PF) 2 units/ml in NS infusion 2,000 Units  1,000 mL Irrigation ONCE Rolla Flatten, NP       ??? lidocaine (XYLOCAINE) 10 mg/mL (1 %) injection 1-30 mL  1-30 mL IntraDERMal ONCE Marveen Reeks, MD       ??? heparinized saline 2 units/mL infusion 1,000 Units  500 mL IntraSHEAth PRN Marveen Reeks, MD        ??? heparin (PF) 2 units/ml in NS infusion 2,000 Units  1,000 mL Irrigation PRN Marveen Reeks, MD       ??? iodixanol (VISIPAQUE) 320 mg iodine/mL contrast injection 100-150 mL  100-150 mL IntraarTERial PRN Marveen Reeks, MD       ??? iodixanol (VISIPAQUE) 320 mg iodine/mL contrast injection 50 mL  50 mL IntraarTERial PRN Marveen Reeks, MD       ??? midazolam (VERSED) injection 2 mg  2 mg IntraVENous Multiple Keonda Dow, MD       ??? fentaNYL citrate (PF) injection 25-100 mcg  25-100 mcg IntraVENous Multiple Marveen Reeks, MD       ??? bivalirudin (ANGIOMAX) BOLUS 87 mg  0.75 mg/kg IntraVENous ONCE Marveen Reeks, MD       ??? bivalirudin (ANGIOMAX) 250 mg in 0.9% sodium chloride (MBP/ADV) 50 mL infusion  1.75 mg/kg/hr IntraVENous CONTINUOUS Marveen Reeks, MD       ??? nitroglycerin (Tridil) 200 mcg/ml infusion  100 mcg IntraCORONary TITRATE Marveen Reeks, MD       ??? nitroglycerin (Tridil) 200 mcg/ml infusion  5-20 mcg/min IntraVENous TITRATE Marveen Reeks, MD       ??? heparin (porcine) 1,000 unit/mL injection 5,000 Units  5,000 Units IntraVENous Multiple Marveen Reeks, MD       ??? heparin (porcine) 1,000 unit/mL injection 1,000-5,000 Units  1,000-5,000 Units Other ONCE Marveen Reeks, MD       ??? nitroglycerin (Tridil) 200 mcg/ml infusion  100-300 mcg IntraarTERial ONCE Marveen Reeks, MD       ??? verapamil (ISOPTIN) 2.5 mg/mL injection 2.5-5 mg  2.5-5 mg IntraarTERial ONCE Marveen Reeks, MD            ALLERGIES:   No Known Allergies    SOCIAL HISTORY:  Social History     Social History   ??? Marital status: MARRIED     Spouse name: N/A   ??? Number of children: N/A   ??? Years of education: N/A     Social History Main Topics   ??? Smoking status: Former Smoker     Packs/day: 2.00     Years: 34.00     Types: Cigarettes     Quit date: 02/20/2011   ??? Smokeless tobacco: Never Used      Comment: e- cig no nicotine   ??? Alcohol use 0.0 oz/week     0 Standard drinks or equivalent per week      Comment: 2shots or beers /week   ??? Drug use: No   ??? Sexual activity: Not Asked      Other Topics Concern   ??? None     Social History Narrative        FAMILY HISTORY:  Family History   Problem Relation Age of Onset   ??? Stroke Mother    ??? Heart Attack Father    ??? High Cholesterol Sister    ??? Hypertension Sister    ??? High Cholesterol Brother    ??? Hypertension Brother        REVIEW OF SYSTEMS:  CV: See above  Resp: See above  All other systems reviewed and negative unless stated otherwise    PHYSICAL EXAM:  Blood pressure 127/83, pulse 86, temperature 97 ??F (36.1 ??C), resp. rate 18, height '5\' 8"'$  (1.727 m), weight 256 lb 2 oz (116.2 kg), SpO2 100 %.  General: NAD, A+Ox3  Eyes: Anicteric, normal appearing conjunctivae and lids  ENMT: Normal externally  Neck: no JVD, no carotid bruit  CV: RRR, normal S1 S2, no M/R/G  Lungs: CTA bilaterally, no W/C/S  Abd: Soft, NT, ND  Extremities: 2 pulses BUEs, 1 B LEs, No C/C/E  Skin: Warm and dry  Neuro: Nonfocal exam, no rigidity/tremor  Psych: Alert and oriented, appropriate affect    LABS AND TESTING:  ?? Labs: Hb 14.9/Cr 1.30/LDL 82.4/HDL 30  ?? EKG (reviewed): NSR PRWPs   ?? TTE: NMl LVEF w at least moderate MR.  ?? TEE (reviewed): Nml LVEF w severe MR- ant leaflet issue w posteriorly directed jet.  ?? ABI (reviewed): 1.2 B w nml waveforms    ASSESSMENT AND PLAN:     54 y.o. male with h/o endocarditis s/p tx now w NYHA 3 symptoms and severe MR as described above. Plan for SCA/LHC/RHC as part of pre-operative evaluation. Pt is scheduled for surgery at [facility] in early December.    He understands the potential risks and benefits of the procedure and agrees to proceed.     Marveen Reeks, MD

## 2015-11-23 ENCOUNTER — Ambulatory Visit
Admit: 2015-11-23 | Discharge: 2015-11-23 | Payer: PRIVATE HEALTH INSURANCE | Attending: Cardiovascular Disease | Primary: Internal Medicine

## 2015-11-23 DIAGNOSIS — R0602 Shortness of breath: Secondary | ICD-10-CM

## 2015-11-23 MED ORDER — AMIODARONE 200 MG TAB
200 mg | ORAL_TABLET | Freq: Every day | ORAL | 1 refills | Status: DC
Start: 2015-11-23 — End: 2015-11-30

## 2015-11-23 MED ORDER — FUROSEMIDE 40 MG TAB
40 mg | ORAL_TABLET | Freq: Every day | ORAL | 3 refills | Status: DC
Start: 2015-11-23 — End: 2015-11-30

## 2015-11-23 NOTE — Progress Notes (Signed)
11/23/2015     Patient:  Bobby Guzman   DOB:  [birth year]       CHIEF COMPLAINT:   Chief Complaint   Patient presents with   ??? Shortness of Breath         HISTORY OF PRESENT ILLNESS: Bobby Guzman presents for cardiology follow up. Had MV repair at [facility] on 11/16/15. Postop course was complicated by some difficulty weaning (? OSA) and parox AF. Overall however, relatively smooth and discharged home yesterday (POD #6).     Last night he noted a "tickle in my throat" with some coughing that was mildly productive of white sputum. Along with the cough, he had significant diffuse pleuritic CP. He also notes orthopnea. VNS came to his home -- heard abnormal lung exam, and suggested he see MD today.    He states that he feels better currently. He has been checking temperature and there have been no fevers. Minimal DOE. No CP other than pleuritic pain with cough.      PAST MEDICAL HISTORY:  Past Medical History   Diagnosis Date   ??? Diabetes ([place])    ??? Endocarditis 2012     dx [facility]; mitral valve; MV repair (Dr. Elio Forget [facility]) 11/16/15   ??? Essential hypertension    ??? Lung cancer ([place]) 2011     s/p LUL resection and chemo   ??? Primary malignant neoplasm of left upper lobe of lung ([place])    ??? Sleep apnea    ??? Ulcerative colitis ([place])         PAST SURGICAL HISTORY:  Past Surgical History   Procedure Laterality Date   ??? Hx other surgical  2011     LUL resection   ??? Pr chest surgery procedure unlisted       left upper lobectomy   ??? Hx heart catheterization     ??? Hx mitral valvuloplasty  11/16/2015     Mitral REPAIR and myomectomy with Dr. Andree Elk, [facility]       MEDICATIONS:  Current Outpatient Prescriptions   Medication Sig Dispense Refill   ??? warfarin (COUMADIN) 2 mg tablet Take 2 mg by mouth daily.     ??? potassium chloride (K-DUR, KLOR-CON) 20 mEq tablet Take  by mouth two (2) times a day.     ??? HYDROmorphone (DILAUDID) 2 mg tablet Take  by mouth every four (4) hours as needed for Pain.      ??? insulin glargine (LANTUS) 100 unit/mL injection by SubCUTAneous route nightly.     ??? furosemide (LASIX) 40 mg tablet Take 1 Tab by mouth daily. 30 Tab 3   ??? amiodarone (CORDARONE) 200 mg tablet Take 1 Tab by mouth daily. 30 Tab 1   ??? metFORMIN (GLUCOPHAGE) 1,000 mg tablet TAKE ONE TAB BY MOUTH TWICE A DAY    **DO NOT RESTART METFORMIN UNTIL Sunday 11/05/15!!!!!!! 1 Tab 0   ??? VICTOZA 3-PAK 0.6 mg/0.1 mL (18 mg/3 mL) sub-q pen 1.8 DAILY 90 DAYS SUPPLY DX:250.62  1   ??? rosuvastatin (CRESTOR) 10 mg tablet Take 1 Tab by mouth nightly. 90 Tab 1   ??? candesartan (ATACAND) 4 mg tablet Take 1 Tab by mouth daily. 90 Tab 1   ??? sub-q insulin device, 40 unit devi by Does Not Apply route.          ALLERGIES:   No Known Allergies    SOCIAL HISTORY:  Social History     Social History   ??? Marital status:  MARRIED     Spouse name: N/A   ??? Number of children: N/A   ??? Years of education: N/A     Social History Main Topics   ??? Smoking status: Former Smoker     Packs/day: 2.00     Years: 34.00     Types: Cigarettes     Quit date: 02/20/2011   ??? Smokeless tobacco: Never Used      Comment: e- cig no nicotine   ??? Alcohol use 0.0 oz/week     0 Standard drinks or equivalent per week      Comment: 2shots or beers /week   ??? Drug use: No   ??? Sexual activity: Not Asked     Other Topics Concern   ??? None     Social History Narrative         FAMILY HISTORY:  Family History   Problem Relation Age of Onset   ??? Stroke Mother    ??? Heart Attack Father    ??? High Cholesterol Sister    ??? Hypertension Sister    ??? High Cholesterol Brother    ??? Hypertension Brother        REVIEW OF SYSTEMS:  Neuro: No headache, focal weakness/numbness, parasthesias  Eyes: No visual disturbance  ENMT: No soar throat, nasal congestion, hearing loss, tinnitus  CV: chest pain  Resp: dyspnea, cough  GI: No abdominal pain, N/V, diarrhea, constipation  GU: No hematuria, dysuria  Skin: No rash  Heme: No bleeding, bruising  Constitutional: No fatigue, weakness, fever, weight change   Musculoskeletal: No myalgias, arthralgias  Other:    PHYSICAL EXAM:  Blood pressure 124/70, pulse 72, temperature 98.2 ??F (36.8 ??C), resp. rate 20, height '5\' 8"'$  (1.727 m), weight 258 lb (117 kg), SpO2 96 %.   General: Well developed, well nourished, no acute distress, appears stated age  Eyes: Anicteric, no hemorrhage, normal appearing conjunctivae and lids  ENMT: Normal externally  Neck: Supple, no JVD, no carotid bruit  CV: RRR, normal S1S2, no M/G/R  Lungs: CTA bilaterally, normal respiratory excursion  Abd: Soft, NT, obese  Extremities: tr-1+ pedal edema at ankles  Skin: Warm and dry  Musculoskeletal: Normal muscle tone, normal gait  Neuro: Nonfocal exam, no rigidity/tremor  Psych: Alert and oriented, appropriate affect      LABS AND TESTING:  ?? EKG: sinus rhythm, LBBB (new)  ?? TEE 01/24/11 - nl LVEF, vegetations on both MV leaflets, moderate MR  ?? Echo 02/24/15 - nl LV/RV size/fxn, moderate ASH, severe LAE, prob moderate MR  ?? ABI/PVR 03/06/15 - nl  ?? TEE 03/07/15 - nl LV/RV size/fxn, anterior MV leaflet with rupture, severe MR  ?? Cath 11/02/15 - no significant CAD, severer MR, LVEDP 21  ?? CXR today - (prelim) small L pleural effusion, L base with increased markings    ASSESSMENT AND PLAN:    1. Cough, SOB - 1 week post MV repair and myomectomy  2. HTN, dyslipidemia  3. Hx endocarditis and subsequent MV rupture with severe MR - s/p MV repair 11/16/15    -Overall symptoms appear relatively mild, lung exam in office is unremarkable, CXR with mild abnormalities (will followup official read)  -EKG with new LBBB which I suspect occurred periop (likely due to myomectomy -- will followup with cardiac surgery)   -Continue lasix 40 daily  -Continue ASA, coumadin, amiodarone, candesartan, crestor  -Check INR today  -Encouraged continued incentive spirometer use  -Advised him to call me if his symptoms worsen, he  develops fevers, etc  -Will arrange followup in 1 week with plans to check labs at that time     Biagio Quint, MD

## 2015-11-24 ENCOUNTER — Encounter: Primary: Internal Medicine

## 2015-11-24 LAB — PROTHROMBIN TIME + INR
INR: 1.67 Ratio — ABNORMAL LOW (ref 2.00–3.00)
Prothrombin time: 19.6 Seconds — ABNORMAL HIGH (ref 11.9–14.9)

## 2015-11-27 NOTE — Addendum Note (Signed)
Addended by: Bernestine Amass on: 11/27/2015 08:32 AM      Modules accepted: Orders

## 2015-11-30 ENCOUNTER — Ambulatory Visit
Admit: 2015-11-30 | Discharge: 2015-11-30 | Payer: PRIVATE HEALTH INSURANCE | Attending: Cardiovascular Disease | Primary: Internal Medicine

## 2015-11-30 DIAGNOSIS — I059 Rheumatic mitral valve disease, unspecified: Secondary | ICD-10-CM

## 2015-11-30 MED ORDER — POTASSIUM CHLORIDE SR 20 MEQ TAB, PARTICLES/CRYSTALS
20 mEq | ORAL_TABLET | Freq: Every day | ORAL | 1 refills | Status: DC
Start: 2015-11-30 — End: 2016-01-11

## 2015-11-30 MED ORDER — METFORMIN 1,000 MG TAB
1000 mg | ORAL_TABLET | ORAL | 1 refills | Status: DC
Start: 2015-11-30 — End: 2016-02-12

## 2015-11-30 MED ORDER — CANDESARTAN 4 MG TAB
4 mg | ORAL_TABLET | Freq: Every day | ORAL | 1 refills | Status: DC
Start: 2015-11-30 — End: 2016-02-12

## 2015-11-30 MED ORDER — AMIODARONE 200 MG TAB
200 mg | ORAL_TABLET | Freq: Every day | ORAL | 1 refills | Status: DC
Start: 2015-11-30 — End: 2015-12-21

## 2015-11-30 MED ORDER — FUROSEMIDE 40 MG TAB
40 mg | ORAL_TABLET | Freq: Every day | ORAL | 3 refills | Status: DC
Start: 2015-11-30 — End: 2016-01-11

## 2015-11-30 MED ORDER — ROSUVASTATIN 10 MG TAB
10 mg | ORAL_TABLET | Freq: Every evening | ORAL | 1 refills | Status: DC
Start: 2015-11-30 — End: 2016-02-12

## 2015-11-30 NOTE — Progress Notes (Signed)
11/30/2015     Patient:  Bobby Guzman   DOB:  1961/09/21       CHIEF COMPLAINT:   Chief Complaint   Patient presents with   ??? Shortness of Breath     still has SOB  fatigue body aches   feels worse         HISTORY OF PRESENT ILLNESS: Bobby Guzman presents for cardiology follow up. Continues to feel diffuse pleuritic CP. Continues to report DOE and orthopnea which have improved. Denies cough or fever. Denies dizziness.    PAST MEDICAL HISTORY:  Past Medical History   Diagnosis Date   ??? Diabetes ([place])    ??? Endocarditis 2012     dx [facility]; mitral valve; MV repair ([facility] Elio [facility]) 11/16/15   ??? Essential hypertension    ??? Lung cancer ([place]) 2011     s/p LUL resection and chemo   ??? Primary malignant neoplasm of left upper lobe of lung ([place])    ??? Sleep apnea    ??? Ulcerative colitis ([place])         PAST SURGICAL HISTORY:  Past Surgical History   Procedure Laterality Date   ??? Hx other surgical  2011     LUL resection   ??? Pr chest surgery procedure unlisted       left upper lobectomy   ??? Hx heart catheterization     ??? Hx mitral valvuloplasty  11/16/2015     Mitral REPAIR and myomectomy with Dr. Andree Elk, [facility]       MEDICATIONS:  Current Outpatient Prescriptions   Medication Sig Dispense Refill   ??? warfarin (COUMADIN) 2 mg tablet Take 2 mg by mouth daily.     ??? potassium chloride (K-DUR, KLOR-CON) 20 mEq tablet Take  by mouth two (2) times a day.     ??? HYDROmorphone (DILAUDID) 2 mg tablet Take  by mouth every four (4) hours as needed for Pain.     ??? insulin glargine (LANTUS) 100 unit/mL injection by SubCUTAneous route nightly.     ??? furosemide (LASIX) 40 mg tablet Take 1 Tab by mouth daily. 30 Tab 3   ??? amiodarone (CORDARONE) 200 mg tablet Take 1 Tab by mouth daily. 30 Tab 1   ??? metFORMIN (GLUCOPHAGE) 1,000 mg tablet TAKE ONE TAB BY MOUTH TWICE A DAY    **DO NOT RESTART METFORMIN UNTIL Sunday 11/05/15!!!!!!! 1 Tab 0   ??? VICTOZA 3-PAK 0.6 mg/0.1 mL (18 mg/3 mL) sub-q pen 1.8 DAILY 90 DAYS SUPPLY DX:250.62  1    ??? rosuvastatin (CRESTOR) 10 mg tablet Take 1 Tab by mouth nightly. 90 Tab 1   ??? candesartan (ATACAND) 4 mg tablet Take 1 Tab by mouth daily. 90 Tab 1   ??? sub-q insulin device, 40 unit devi by Does Not Apply route.          ALLERGIES:   No Known Allergies    SOCIAL HISTORY:  Social History     Social History   ??? Marital status: MARRIED     Spouse name: N/A   ??? Number of children: N/A   ??? Years of education: N/A     Social History Main Topics   ??? Smoking status: Former Smoker     Packs/day: 2.00     Years: 34.00     Types: Cigarettes     Quit date: 02/20/2011   ??? Smokeless tobacco: Never Used      Comment: e- cig no nicotine   ???  Alcohol use 0.0 oz/week     0 Standard drinks or equivalent per week      Comment: 2shots or beers /week   ??? Drug use: No   ??? Sexual activity: Not Asked     Other Topics Concern   ??? None     Social History Narrative         FAMILY HISTORY:  Family History   Problem Relation Age of Onset   ??? Stroke Mother    ??? Heart Attack Father    ??? High Cholesterol Sister    ??? Hypertension Sister    ??? High Cholesterol Brother    ??? Hypertension Brother        REVIEW OF SYSTEMS:  Neuro: No headache, focal weakness/numbness, parasthesias  Eyes: No visual disturbance  ENMT: No soar throat, nasal congestion, hearing loss, tinnitus  CV: chest pain  Resp: dyspnea  GI: No abdominal pain, N/V, diarrhea, constipation  GU: No hematuria, dysuria  Skin: No rash  Heme: No bleeding, bruising  Constitutional: No fatigue, weakness, fever, weight change  Musculoskeletal: No myalgias, arthralgias  Other:    PHYSICAL EXAM:  Blood pressure 120/72, pulse 86, temperature 97.8 ??F (36.6 ??C), height '5\' 8"'$  (1.727 m), SpO2 98 %.   General: Well developed, well nourished, no acute distress, appears stated age  Eyes: Anicteric, no hemorrhage, normal appearing conjunctivae and lids  ENMT: Normal externally  Neck: Supple, no JVD, no carotid bruit  CV: RRR, normal S1S2, no M/G/R  Lungs: CTA bilaterally, normal respiratory excursion   Abd: Soft, NT, ND, no palpable mass  Extremities: No pedal edema  Skin: Warm and dry  Musculoskeletal: Normal muscle tone, normal gait  Neuro: Nonfocal exam, no rigidity/tremor  Psych: Alert and oriented, appropriate affect      LABS AND TESTING:  ?? Labs: 11/23/15 - INR 1.7  ?? TEE 01/24/11 - nl LVEF, vegetations on both MV leaflets, moderate MR  ?? Echo 02/24/15 - nl LV/RV size/fxn, moderate ASH, severe LAE, prob moderate MR  ?? ABI/PVR 03/06/15 - nl  ?? TEE 03/07/15 - nl LV/RV size/fxn, anterior MV leaflet with rupture, severe MR  ?? Cath 11/02/15 - no significant CAD, severer MR, LVEDP 21  ?? CXR 11/23/15 - cardiomegaly, L pleural effusion, linear streaking R base    ASSESSMENT AND PLAN:    1. SOB - 2 weeks post MV repair and myomectomy  2. HTN, dyslipidemia  3. Hx endocarditis and subsequent MV rupture with severe MR - s/p MV repair 11/16/15  ??  -Slow but still appropriate recovery post OHS  -Confirmed that EKG with LBBB post myomectomy was noted prior to discharge  -Continue lasix 40 daily, KCl 20 daily  -Continue ASA, coumadin, amiodarone, candesartan, crestor  -Check full labs including INR today  -Will arrange echo, 24 hr holter, and short term followup    Biagio Quint, MD

## 2015-12-01 LAB — MAGNESIUM: Magnesium: 1.9 mg/dL (ref 1.5–2.5)

## 2015-12-01 LAB — METABOLIC PANEL, COMPREHENSIVE
A-G Ratio: 0.9 Ratio (ref 0.9–2.4)
ALT (SGPT): 13 IU/L (ref 9–60)
AST (SGOT): 17 IU/L (ref 10–40)
Albumin: 3.7 g/dL (ref 3.6–5.3)
Alk. phosphatase: 114 IU/L (ref 40–115)
BUN/Creatinine ratio: 18.1 (ref 5.3–50.0)
BUN: 21 mg/dL (ref 7–25)
Bilirubin, total: 0.5 mg/dL (ref 0.2–1.2)
CO2: 24 mmol/L (ref 21–33)
Calcium: 9.2 mg/dL (ref 8.6–10.4)
Chloride: 100 mmol/L (ref 98–110)
Creatinine: 1.16 mg/dL (ref 0.50–1.30)
Estimated GFR: 65 (ref >60)
Globulin: 4.1 g/dL — ABNORMAL HIGH (ref 1.8–3.6)
Glucose: 93 mg/dL (ref 65–99)
Potassium: 4.5 mmol/L (ref 3.5–5.5)
Protein, total: 7.8 g/dL (ref 6.2–8.3)
Sodium: 135 mmol/L (ref 135–146)
eGFR If African American: 79 (ref >60)

## 2015-12-01 LAB — CBC WITH AUTOMATED DIFF
ABS. BASOPHILS: 0.1 10*3/uL (ref <0.3)
ABS. EOSINOPHILS: 0.1 10*3/uL (ref <0.7)
ABS. MONOCYTES: 0.7 10*3/uL (ref <1.0)
ABS. NEUTROPHILS: 8.6 10*3/uL — ABNORMAL HIGH (ref 1.6–7.8)
Abs Lymphocytes: 3.2 10*3/uL (ref 1.0–4.5)
BASOPHILS: 0.4 % (ref 0.0–2.0)
EOSINOPHILS: 0.8 % (ref 0.0–7.0)
HCT: 37.3 % — ABNORMAL LOW (ref 38.0–52.0)
HGB: 12.3 g/dL — ABNORMAL LOW (ref 12.5–17.5)
Lymphocytes: 25.6 % (ref 15.0–45.0)
MCH: 28.2 pg (ref 25.0–33.0)
MCHC: 32.9 g/dL (ref 30.0–35.0)
MCV: 86 fL (ref 80–102)
MEAN PLATELET VOLUME: 7.3 fL (ref 6.8–13.0)
MONOCYTES: 5.2 % (ref 2.0–10.0)
NEUTROPHILS: 67.9 % (ref 40.0–70.0)
PLATELET: 610 10*3/uL — CR (ref 150–450)
RBC: 4.34 x10E6/uL (ref 4.20–6.00)
RDW-CV: 15.3 % — ABNORMAL HIGH (ref 11.5–15.0)
WBC: 12.6 10*3/uL — ABNORMAL HIGH (ref 4.0–11.0)

## 2015-12-01 LAB — HEMOGLOBIN A1C W/O EAG: Hemoglobin A1c: 7.3 % — ABNORMAL HIGH (ref 4.0–6.0)

## 2015-12-01 LAB — LIPID PANEL
Cholesterol, total: 146 mg/dL (ref 125–200)
HDL Chol (%): 21.2 % (ref >15)
HDL Cholesterol: 31 mg/dL — ABNORMAL LOW (ref >=40)
LDL, calculated: 87 mg/dL (ref <130)
Non-HDL Cholesterol: 115 mg/dL
T. Chol/HDL Ratio: 4.7 Ratio (ref <5.00)
Triglyceride: 138 mg/dL (ref <150)
VLDL, calculated: 28 (ref 8–35)

## 2015-12-01 LAB — T3 TOTAL: T3, total: 88 ng/dL (ref 60–181)

## 2015-12-01 LAB — T4, FREE: T4, Free: 1.35 ng/dL (ref 0.80–1.80)

## 2015-12-01 LAB — TSH 3RD GENERATION: TSH: 2.24 u[IU]/mL (ref 0.36–4.42)

## 2015-12-01 LAB — PROTHROMBIN TIME + INR
INR: 1.31 Ratio — ABNORMAL LOW (ref 2.00–3.00)
Prothrombin time: 16.1 Seconds — ABNORMAL HIGH (ref 11.9–14.9)

## 2015-12-01 LAB — ESTIMATED AVERAGE GLUCOSE (SHIEL ONLY): Estimated average glucose: 163 mg/dL

## 2015-12-01 LAB — T3, FREE: Triiodothyronine (T3), free: 2.9 pg/mL (ref 2.3–4.2)

## 2015-12-01 MED ORDER — HYDROMORPHONE 2 MG TAB
2 mg | ORAL_TABLET | Freq: Four times a day (QID) | ORAL | 0 refills | Status: DC | PRN
Start: 2015-12-01 — End: 2016-03-18

## 2015-12-04 ENCOUNTER — Encounter: Attending: Cardiovascular Disease | Primary: Internal Medicine

## 2015-12-04 NOTE — Telephone Encounter (Signed)
Prior Authorization required through [employer].

## 2015-12-05 NOTE — Telephone Encounter (Signed)
Patient's son called stating prescription for hydromorphone was not at pharmacy. I spoke to [employer] manager) at [facility], she states prescription was picked up by patient on 12/02/2015.

## 2015-12-07 NOTE — Telephone Encounter (Signed)
Prior authorization on file does not expire until 12/16/2015. Documentation processed for authorization for the next year.

## 2015-12-08 ENCOUNTER — Ambulatory Visit: Discharge: 2015-12-08 | Payer: PRIVATE HEALTH INSURANCE | Primary: Internal Medicine

## 2015-12-08 DIAGNOSIS — I4891 Unspecified atrial fibrillation: Secondary | ICD-10-CM

## 2015-12-08 LAB — AMB POC PT/INR: INR POC: 2.7

## 2015-12-10 MED ORDER — WARFARIN 2 MG TAB
2 mg | ORAL_TABLET | Freq: Every day | ORAL | 3 refills | Status: DC
Start: 2015-12-10 — End: 2016-01-12

## 2015-12-13 ENCOUNTER — Ambulatory Visit: Discharge: 2015-12-14 | Payer: PRIVATE HEALTH INSURANCE | Primary: Internal Medicine

## 2015-12-13 ENCOUNTER — Encounter: Primary: Internal Medicine

## 2015-12-13 DIAGNOSIS — I480 Paroxysmal atrial fibrillation: Secondary | ICD-10-CM

## 2015-12-13 LAB — AMB POC PT/INR: INR POC: 2.9

## 2015-12-14 ENCOUNTER — Encounter: Primary: Internal Medicine

## 2015-12-20 ENCOUNTER — Ambulatory Visit: Discharge: 2015-12-22 | Payer: PRIVATE HEALTH INSURANCE | Primary: Internal Medicine

## 2015-12-20 DIAGNOSIS — I480 Paroxysmal atrial fibrillation: Secondary | ICD-10-CM

## 2015-12-20 LAB — AMB POC PT/INR: INR POC: 1.8

## 2015-12-21 ENCOUNTER — Encounter

## 2015-12-21 ENCOUNTER — Ambulatory Visit
Admit: 2015-12-21 | Discharge: 2015-12-21 | Payer: PRIVATE HEALTH INSURANCE | Attending: Cardiovascular Disease | Primary: Internal Medicine

## 2015-12-21 DIAGNOSIS — R0602 Shortness of breath: Secondary | ICD-10-CM

## 2015-12-21 NOTE — Progress Notes (Signed)
12/21/2015     Patient:  Bobby Guzman   DOB:  [birth year]       CHIEF COMPLAINT:   Chief Complaint   Patient presents with   ??? Shortness of Breath         HISTORY OF PRESENT ILLNESS: Bobby Guzman presents for cardiology follow up. DOE continues to improve and mild at this point. Mild orthopnea. Denies fever, chills,  diarrhea. Occasional mild nonproductive cough. Occasional L sided CP -- since surgery, gradually improving, worse with sneeze/laugh.     PAST MEDICAL HISTORY:  Past Medical History   Diagnosis Date   ??? Diabetes ([place])    ??? Endocarditis 2012     dx [facility]; mitral valve; MV repair (Dr. Elio Forget [facility]) 11/16/15   ??? Essential hypertension    ??? Lung cancer ([place]) 2011     s/p LUL resection and chemo   ??? Primary malignant neoplasm of left upper lobe of lung ([place])    ??? Sleep apnea    ??? Ulcerative colitis ([place])         PAST SURGICAL HISTORY:  Past Surgical History   Procedure Laterality Date   ??? Hx other surgical  2011     LUL resection   ??? Pr chest surgery procedure unlisted       left upper lobectomy   ??? Hx heart catheterization     ??? Hx mitral valvuloplasty  11/16/2015     Mitral REPAIR and myomectomy with Dr. Andree Elk, [facility]       MEDICATIONS:  Current Outpatient Prescriptions   Medication Sig Dispense Refill   ??? warfarin (COUMADIN) 2 mg tablet Take 2 Tabs by mouth daily. 4 mg daily except Sunday take 2 mg. 180 Tab 3   ??? HYDROmorphone (DILAUDID) 2 mg tablet Take 1 Tab by mouth every six (6) hours as needed for Pain. Max Daily Amount: 8 mg. 30 Tab 0   ??? potassium chloride (K-DUR, KLOR-CON) 20 mEq tablet Take 1 Tab by mouth daily. 30 Tab 1   ??? furosemide (LASIX) 40 mg tablet Take 1 Tab by mouth daily. 30 Tab 3   ??? amiodarone (CORDARONE) 200 mg tablet Take 1 Tab by mouth daily. 30 Tab 1   ??? rosuvastatin (CRESTOR) 10 mg tablet Take 1 Tab by mouth nightly. 90 Tab 1   ??? metFORMIN (GLUCOPHAGE) 1,000 mg tablet TAKE ONE TAB BY MOUTH TWICE A DAY     **DO NOT RESTART METFORMIN UNTIL Sunday 11/05/15!!!!!!! 180 Tab 1   ??? candesartan (ATACAND) 4 mg tablet Take 1 Tab by mouth daily. 90 Tab 1   ??? insulin glargine (LANTUS) 100 unit/mL injection by SubCUTAneous route nightly.     ??? VICTOZA 3-PAK 0.6 mg/0.1 mL (18 mg/3 mL) sub-q pen 1.8 DAILY 90 DAYS SUPPLY DX:250.62  1   ??? sub-q insulin device, 40 unit devi by Does Not Apply route.          ALLERGIES:   No Known Allergies    SOCIAL HISTORY:  Social History     Social History   ??? Marital status: MARRIED     Spouse name: N/A   ??? Number of children: N/A   ??? Years of education: N/A     Social History Main Topics   ??? Smoking status: Former Smoker     Packs/day: 2.00     Years: 34.00     Types: Cigarettes     Quit date: 02/20/2011   ??? Smokeless tobacco: Never Used  Comment: e- cig no nicotine   ??? Alcohol use 0.0 oz/week     0 Standard drinks or equivalent per week      Comment: 2shots or beers /week   ??? Drug use: No   ??? Sexual activity: Not Asked     Other Topics Concern   ??? None     Social History Narrative         FAMILY HISTORY:  Family History   Problem Relation Age of Onset   ??? Stroke Mother    ??? Heart Attack Father    ??? High Cholesterol Sister    ??? Hypertension Sister    ??? High Cholesterol Brother    ??? Hypertension Brother        REVIEW OF SYSTEMS:  Neuro: No headache, focal weakness/numbness, parasthesias  Eyes: No visual disturbance  ENMT: No soar throat, nasal congestion, hearing loss, tinnitus  CV: chest pain  Resp: dyspnea, cough  GI: No abdominal pain, N/V, diarrhea, constipation  GU: No hematuria, dysuria  Skin: No rash  Heme: No bleeding, bruising  Constitutional: No fatigue, weakness, fever, weight change  Musculoskeletal: No myalgias, arthralgias  Other:    PHYSICAL EXAM:  Blood pressure 110/72, pulse (!) 102, resp. rate 16, height '5\' 8"'$  (1.727 m), weight 250 lb (113.4 kg), SpO2 98 %.   General: Well developed, well nourished, no acute distress, appears stated age   Eyes: Anicteric, no hemorrhage, normal appearing conjunctivae and lids  ENMT: Normal externally  Neck: Supple, no JVD, no carotid bruit  CV: RRR, normal S1S2, no M/G/R  Lungs: decr BS R base, normal respiratory excursion  Abd: Soft, NT, obese  Extremities: No pedal edema  Skin: Warm and dry  Musculoskeletal: Normal muscle tone, normal gait  Neuro: Nonfocal exam, no rigidity/tremor  Psych: Alert and oriented, appropriate affect      LABS AND TESTING:  ?? EKG: sinus, LBBB  ?? Labs: 11/30/15 - CBC nl (x wbc 12.6, H/H 12.3, plt 610), CMP nl (incl K 4.5, creat 1.2), HDL 31, TG 138, LDL 87, A1c 7.3, TSH nl  ?? TEE 01/24/11 - nl LVEF, vegetations on both MV leaflets, moderate MR  ?? ABI/PVR 03/06/15 - nl  ?? TEE 03/07/15 - nl LV/RV size/fxn, anterior MV leaflet with rupture, severe MR  ?? Cath 11/02/15 - no significant CAD, severe MR, LVEDP 21  ?? CXR 11/23/15 - cardiomegaly, L pleural effusion, linear streaking R base  ?? Echo 12/13/15 - moderate LVH, LVEF 50-55, severe LAE, nl RV, MV annuloplasty ring with trace MR  ?? 24 hr holter 12/13/15 - NSR 60-98 (avg 75), IVCD, occasional PVC, NSVT vs SVT (more likely) x 1 for 13 beats, no AF    ASSESSMENT AND PLAN:    1. HTN, dyslipidemia  2. Hx endocarditis and subsequent MV rupture with severe MR - s/p MV repair 11/16/15  ????  -Gradual but appropriate recovery post OHS  -BP stable, clinically ~euvolemic, repeat echo as above  -Unclear why resting HR has increased -- 24 hr holter and subsequent EKG's sinus rhythm; no evidence of infection/dehydration/etc  -DC amiodarone  -Continue ASA, coumadin, candesartan, crestor  -Continue lasix 40 daily, KCl 20 daily -- follow labs, will consider [place] these at future appt pending continued clinical improvement  -Also plan on future event monitor and possible discontinuation of coumadin pending this  -Seeing Dr. Andree Elk next week and will arrange followup here in ~1 month    Biagio Quint, MD

## 2015-12-27 ENCOUNTER — Encounter

## 2015-12-29 ENCOUNTER — Ambulatory Visit: Discharge: 2015-12-29 | Payer: PRIVATE HEALTH INSURANCE | Primary: Internal Medicine

## 2015-12-29 DIAGNOSIS — I480 Paroxysmal atrial fibrillation: Secondary | ICD-10-CM

## 2015-12-29 LAB — AMB POC PT/INR: INR POC: 2.2

## 2015-12-29 NOTE — Telephone Encounter (Signed)
Pt came in for INR today and stated he will be going to [place] for 2 months and it will be difficult for him to get INR's done there. He requested to be changed to another anticoagulant. Discussed with Dr Ida Rogue and as per his direction, advised pt that he is on anticoagulants for valve repair and while Dr Ida Rogue feels comfortable prescribing it, it would be off label. Pt will think about it and let us know what he decides.

## 2016-01-02 ENCOUNTER — Encounter: Attending: Cardiovascular Disease | Primary: Internal Medicine

## 2016-01-03 ENCOUNTER — Ambulatory Visit: Discharge: 2016-01-03 | Payer: PRIVATE HEALTH INSURANCE | Primary: Internal Medicine

## 2016-01-03 DIAGNOSIS — I480 Paroxysmal atrial fibrillation: Secondary | ICD-10-CM

## 2016-01-03 LAB — AMB POC PT/INR: INR POC: 2.4

## 2016-01-10 ENCOUNTER — Encounter: Payer: PRIVATE HEALTH INSURANCE | Primary: Internal Medicine

## 2016-01-10 LAB — PT/INR EXTERNAL: INR, External: 2.8 (ref 2–3)

## 2016-01-10 NOTE — Progress Notes (Signed)
Bobby Guzman is a 55 y.o. male who presents today for Anticoagulation monitoring.    Indication: Atrial Fibrillation  INR Goal: 2.0-3.0.  Current dose:  Coumadin '4mg'$  daily.  Missed Coumadin Doses:  None  Medication Changes:  no  Dietary Changes:  no    Symptoms: taking coumadin appropriately without any bleeding.    Latest INRs:  Lab Results   Component Value Date/Time    INR 1.31 11/30/2015 09:40 PM    INR 1.67 11/23/2015 10:58 PM    INR 1.0 11/02/2015 08:11 AM    INR, External 2.8 01/10/2016    INR POC 2.4 01/03/2016 02:40 PM    INR POC 2.2 12/29/2015 12:32 PM    INR POC 1.8 12/20/2015 05:17 PM    Prothrombin time 16.1 11/30/2015 09:40 PM    Prothrombin time 19.6 11/23/2015 10:58 PM    Prothrombin time 10.2 11/02/2015 08:11 AM        New Coumadin dose:.current treatment plan is effective, no change in therapy.    Next check to be scheduled for  2 weeks.

## 2016-01-11 ENCOUNTER — Ambulatory Visit
Admit: 2016-01-11 | Discharge: 2016-01-11 | Payer: PRIVATE HEALTH INSURANCE | Attending: Cardiovascular Disease | Primary: Internal Medicine

## 2016-01-11 DIAGNOSIS — I4819 Other persistent atrial fibrillation: Secondary | ICD-10-CM

## 2016-01-11 MED ORDER — BISOPROLOL FUMARATE 5 MG TAB
5 mg | ORAL_TABLET | Freq: Every day | ORAL | 5 refills | Status: DC
Start: 2016-01-11 — End: 2016-01-12

## 2016-01-11 NOTE — Progress Notes (Signed)
01/11/2016     Patient:  Gracelyn Nurse   DOB:  May 24, 1961       CHIEF COMPLAINT:   Chief Complaint   Patient presents with   ??? Hypertension     f/u SOB   ATRIAL FIB   STILL HAS SOB         HISTORY OF PRESENT ILLNESS: Rylin Guardiola presents for cardiology follow up. DOE has persisted. Also with orthopnea. No PND. Occasional scant nonproductive cough. Denies CP, dizziness, palps.    Interestingly he reports HR almost always 96 BPM (on Apple Watch).    PAST MEDICAL HISTORY:  Past Medical History   Diagnosis Date   ??? Diabetes ([place])    ??? Endocarditis 2012     dx [facility]; mitral valve; MV repair (Dr. Elio [facility]get [facility]) 11/16/15   ??? Essential hypertension    ??? Lung cancer ([place]) 2011     s/p LUL resection and chemo   ??? Primary malignant neoplasm of left upper lobe of lung ([place])    ??? Sleep apnea    ??? Ulcerative colitis ([place])         PAST SURGICAL HISTORY:  Past Surgical History   Procedure Laterality Date   ??? Hx other surgical  2011     LUL resection   ??? Pr chest surgery procedure unlisted       left upper lobectomy   ??? Hx heart catheterization     ??? Hx mitral valvuloplasty  11/16/2015     Mitral REPAIR and myomectomy with Dr. Andree Elk, [facility]       MEDICATIONS:  Current Outpatient Prescriptions   Medication Sig Dispense Refill   ??? bisoprolol (ZEBETA) 5 mg tablet Take 1 Tab by mouth daily. 30 Tab 5   ??? warfarin (COUMADIN) 2 mg tablet Take 2 Tabs by mouth daily. 4 mg daily except Sunday take 2 mg. 180 Tab 3   ??? HYDROmorphone (DILAUDID) 2 mg tablet Take 1 Tab by mouth every six (6) hours as needed for Pain. Max Daily Amount: 8 mg. 30 Tab 0   ??? rosuvastatin (CRESTOR) 10 mg tablet Take 1 Tab by mouth nightly. 90 Tab 1   ??? metFORMIN (GLUCOPHAGE) 1,000 mg tablet TAKE ONE TAB BY MOUTH TWICE A DAY    **DO NOT RESTART METFORMIN UNTIL Sunday 11/05/15!!!!!!! 180 Tab 1   ??? candesartan (ATACAND) 4 mg tablet Take 1 Tab by mouth daily. 90 Tab 1   ??? insulin glargine (LANTUS) 100 unit/mL injection by SubCUTAneous route nightly.      ??? VICTOZA 3-PAK 0.6 mg/0.1 mL (18 mg/3 mL) sub-q pen 1.8 DAILY 90 DAYS SUPPLY DX:250.62  1   ??? sub-q insulin device, 40 unit devi by Does Not Apply route.          ALLERGIES:   No Known Allergies    SOCIAL HISTORY:  Social History     Social History   ??? Marital status: MARRIED     Spouse name: N/A   ??? Number of children: N/A   ??? Years of education: N/A     Social History Main Topics   ??? Smoking status: Former Smoker     Packs/day: 2.00     Years: 34.00     Types: Cigarettes     Quit date: 02/20/2011   ??? Smokeless tobacco: Never Used      Comment: e- cig no nicotine   ??? Alcohol use 0.0 oz/week     0 Standard drinks or equivalent per  week      Comment: 2shots or beers /week   ??? Drug use: No   ??? Sexual activity: Not Asked     Other Topics Concern   ??? None     Social History Narrative         FAMILY HISTORY:  Family History   Problem Relation Age of Onset   ??? Stroke Mother    ??? Heart Attack Father    ??? High Cholesterol Sister    ??? Hypertension Sister    ??? High Cholesterol Brother    ??? Hypertension Brother        REVIEW OF SYSTEMS:  Neuro: No headache, focal weakness/numbness, parasthesias  Eyes: No visual disturbance  ENMT: No soar throat, nasal congestion, hearing loss, tinnitus  CV: No chest pain, palpitations, dizziness, syncope, claudication  Resp: dyspnea, cough  GI: No abdominal pain, N/V, diarrhea, constipation  GU: No hematuria, dysuria  Skin: No rash  Heme: No bleeding, bruising  Constitutional: No fatigue, weakness, fever, weight change  Musculoskeletal: No myalgias, arthralgias  Other:    PHYSICAL EXAM:  Blood pressure 108/70, pulse 96, resp. rate 16, height '5\' 8"'$  (1.727 m), weight 250 lb (113.4 kg), SpO2 97 %.   General: Well developed, well nourished, no acute distress, appears stated age  Eyes: Anicteric, no hemorrhage, normal appearing conjunctivae and lids  ENMT: Normal externally  Neck: Supple, no JVD, no carotid bruit  CV: RRR, normal S1S2, no M/G/R  Lungs: CTA bilaterally, normal respiratory excursion   Abd: Soft, NT, obese  Extremities: No pedal edema  Skin: Warm and dry  Musculoskeletal: Normal muscle tone, normal gait  Neuro: Nonfocal exam, no rigidity/tremor  Psych: Alert and oriented, appropriate affect      LABS AND TESTING:  ?? EKG: sinus (vs A-tach with 2:1 conduction), LBBB  ?? TEE 01/24/11 - nl LVEF, vegetations on both MV leaflets, moderate MR  ?? ABI/PVR 03/06/15 - nl  ?? TEE 03/07/15 - nl LV/RV size/fxn, anterior MV leaflet with rupture, severe MR  ?? Cath 11/02/15 - no significant CAD, severe MR, LVEDP 21  ?? CXR 11/23/15 - cardiomegaly, L pleural effusion, linear streaking R base  ?? Echo 12/13/15 - moderate LVH, LVEF 50-55, severe LAE, nl RV, MV annuloplasty ring with trace MR  ?? 24 hr holter 12/13/15 - NSR 60-98 (avg 75), IVCD, occasional PVC, NSVT vs SVT (more likely) x 1 for 13 beats, no AF    ASSESSMENT AND PLAN:    1. HTN - Blood pressure is well controlled. Continue current antihypertensive regimen.  2. Dyslipidemia - Recent lipids unknown. Continue current medications and follow up labs.  3. Hx endocarditis and subsequent MV rupture with severe MR - S/p MV repair 11/16/15. Clinically stable in this regard with postop echo as above. Plan on serial echo and dental endocarditis ppx going forward.  4. Atrial fibrillation - Paroxysmal and brief postop. Unclear (even after careful review of all EKG's from 10/17/15 through today) but very suspicious for aflutter/atach. HR has been exactly 96 bpm on EKG's this month, his Apple Watch, etc. Persisted at 96 bpm even with leg-lifts in office while hooked up to monitor. Unfortunately valsalva and carotid sinus massage did not elicit a transient AVN blockade to help clarify this. He has remained SOB, which may be due to atrial arrhythmia. Will arrange for TEE/CV (12/20/15 INR was 1.8, otherwise has been above 2 since 12/08/15, including 2.2 on 12/29/15 and 2.8 yesterday), but will try to clarify rhythm with limited  TTE prior.      Biagio Quint, MD

## 2016-01-11 NOTE — H&P (View-Only) (Signed)
01/11/2016     Patient:  Bobby Guzman   DOB:  20-Aug-1961       CHIEF COMPLAINT:   Chief Complaint   Patient presents with   ??? Hypertension     f/u SOB   ATRIAL FIB   STILL HAS SOB         HISTORY OF PRESENT ILLNESS: Bobby Guzman presents for cardiology follow up. DOE has persisted. Also with orthopnea. No PND. Occasional scant nonproductive cough. Denies CP, dizziness, palps.    Interestingly he reports HR almost always 96 BPM (on Apple Watch).    PAST MEDICAL HISTORY:  Past Medical History   Diagnosis Date   ??? Diabetes ([place])    ??? Endocarditis 2012     dx [facility]; mitral valve; MV repair (Dr. Elio Forget [facility]) 11/16/15   ??? Essential hypertension    ??? Lung cancer ([place]) 2011     s/p LUL resection and chemo   ??? Primary malignant neoplasm of left upper lobe of lung ([place])    ??? Sleep apnea    ??? Ulcerative colitis ([place])         PAST SURGICAL HISTORY:  Past Surgical History   Procedure Laterality Date   ??? Hx other surgical  2011     LUL resection   ??? Pr chest surgery procedure unlisted       left upper lobectomy   ??? Hx heart catheterization     ??? Hx mitral valvuloplasty  11/16/2015     Mitral REPAIR and myomectomy with Dr. Andree Elk, [facility]       MEDICATIONS:  Current Outpatient Prescriptions   Medication Sig Dispense Refill   ??? bisoprolol (ZEBETA) 5 mg tablet Take 1 Tab by mouth daily. 30 Tab 5   ??? warfarin (COUMADIN) 2 mg tablet Take 2 Tabs by mouth daily. 4 mg daily except Sunday take 2 mg. 180 Tab 3   ??? HYDROmorphone (DILAUDID) 2 mg tablet Take 1 Tab by mouth every six (6) hours as needed for Pain. Max Daily Amount: 8 mg. 30 Tab 0   ??? rosuvastatin (CRESTOR) 10 mg tablet Take 1 Tab by mouth nightly. 90 Tab 1   ??? metFORMIN (GLUCOPHAGE) 1,000 mg tablet TAKE ONE TAB BY MOUTH TWICE A DAY    **DO NOT RESTART METFORMIN UNTIL Sunday 11/05/15!!!!!!! 180 Tab 1   ??? candesartan (ATACAND) 4 mg tablet Take 1 Tab by mouth daily. 90 Tab 1   ??? insulin glargine (LANTUS) 100 unit/mL injection by SubCUTAneous route nightly.      ??? VICTOZA 3-PAK 0.6 mg/0.1 mL (18 mg/3 mL) sub-q pen 1.8 DAILY 90 DAYS SUPPLY DX:250.62  1   ??? sub-q insulin device, 40 unit devi by Does Not Apply route.          ALLERGIES:   No Known Allergies    SOCIAL HISTORY:  Social History     Social History   ??? Marital status: MARRIED     Spouse name: N/A   ??? Number of children: N/A   ??? Years of education: N/A     Social History Main Topics   ??? Smoking status: Former Smoker     Packs/day: 2.00     Years: 34.00     Types: Cigarettes     Quit date: 02/20/2011   ??? Smokeless tobacco: Never Used      Comment: e- cig no nicotine   ??? Alcohol use 0.0 oz/week     0 Standard drinks or equivalent per  week      Comment: 2shots or beers /week   ??? Drug use: No   ??? Sexual activity: Not Asked     Other Topics Concern   ??? None     Social History Narrative         FAMILY HISTORY:  Family History   Problem Relation Age of Onset   ??? Stroke Mother    ??? Heart Attack Father    ??? High Cholesterol Sister    ??? Hypertension Sister    ??? High Cholesterol Brother    ??? Hypertension Brother        REVIEW OF SYSTEMS:  Neuro: No headache, focal weakness/numbness, parasthesias  Eyes: No visual disturbance  ENMT: No soar throat, nasal congestion, hearing loss, tinnitus  CV: No chest pain, palpitations, dizziness, syncope, claudication  Resp: dyspnea, cough  GI: No abdominal pain, N/V, diarrhea, constipation  GU: No hematuria, dysuria  Skin: No rash  Heme: No bleeding, bruising  Constitutional: No fatigue, weakness, fever, weight change  Musculoskeletal: No myalgias, arthralgias  Other:    PHYSICAL EXAM:  Blood pressure 108/70, pulse 96, resp. rate 16, height '5\' 8"'$  (1.727 m), weight 250 lb (113.4 kg), SpO2 97 %.   General: Well developed, well nourished, no acute distress, appears stated age  Eyes: Anicteric, no hemorrhage, normal appearing conjunctivae and lids  ENMT: Normal externally  Neck: Supple, no JVD, no carotid bruit  CV: RRR, normal S1S2, no M/G/R  Lungs: CTA bilaterally, normal respiratory excursion   Abd: Soft, NT, obese  Extremities: No pedal edema  Skin: Warm and dry  Musculoskeletal: Normal muscle tone, normal gait  Neuro: Nonfocal exam, no rigidity/tremor  Psych: Alert and oriented, appropriate affect      LABS AND TESTING:  ?? EKG: sinus (vs A-tach with 2:1 conduction), LBBB  ?? TEE 01/24/11 - nl LVEF, vegetations on both MV leaflets, moderate MR  ?? ABI/PVR 03/06/15 - nl  ?? TEE 03/07/15 - nl LV/RV size/fxn, anterior MV leaflet with rupture, severe MR  ?? Cath 11/02/15 - no significant CAD, severe MR, LVEDP 21  ?? CXR 11/23/15 - cardiomegaly, L pleural effusion, linear streaking R base  ?? Echo 12/13/15 - moderate LVH, LVEF 50-55, severe LAE, nl RV, MV annuloplasty ring with trace MR  ?? 24 hr holter 12/13/15 - NSR 60-98 (avg 75), IVCD, occasional PVC, NSVT vs SVT (more likely) x 1 for 13 beats, no AF    ASSESSMENT AND PLAN:    1. HTN - Blood pressure is well controlled. Continue current antihypertensive regimen.  2. Dyslipidemia - Recent lipids unknown. Continue current medications and follow up labs.  3. Hx endocarditis and subsequent MV rupture with severe MR - S/p MV repair 11/16/15. Clinically stable in this regard with postop echo as above. Plan on serial echo and dental endocarditis ppx going forward.  4. Atrial fibrillation - Paroxysmal and brief postop. Unclear (even after careful review of all EKG's from 10/17/15 through today) but very suspicious for aflutter/atach. HR has been exactly 96 bpm on EKG's this month, his Apple Watch, etc. Persisted at 96 bpm even with leg-lifts in office while hooked up to monitor. Unfortunately valsalva and carotid sinus massage did not elicit a transient AVN blockade to help clarify this. He has remained SOB, which may be due to atrial arrhythmia. Will arrange for TEE/CV (12/20/15 INR was 1.8, otherwise has been above 2 since 12/08/15, including 2.2 on 12/29/15 and 2.8 yesterday), but will try to clarify rhythm with limited  TTE prior.      Biagio Quint, MD

## 2016-01-12 ENCOUNTER — Encounter

## 2016-01-12 ENCOUNTER — Inpatient Hospital Stay: Admit: 2016-01-12 | Payer: PRIVATE HEALTH INSURANCE | Attending: Cardiovascular Disease | Primary: Internal Medicine

## 2016-01-12 DIAGNOSIS — I480 Paroxysmal atrial fibrillation: Secondary | ICD-10-CM

## 2016-01-12 LAB — METABOLIC PANEL, BASIC
Anion gap: 10 mmol/L (ref 10–20)
BUN: 20 mg/dL — ABNORMAL HIGH (ref 7–18)
CO2: 27 mmol/L (ref 21–32)
Calcium: 9 mg/dL (ref 8.5–10.1)
Chloride: 106 mmol/L (ref 98–107)
Creatinine: 1.28 mg/dL (ref 0.70–1.30)
GFR est AA: >60 mL/min/{1.73_m2} (ref >60)
GFR est non-AA: >60 mL/min/{1.73_m2} (ref >60)
Glucose: 97 mg/dL (ref 74–106)
Potassium: 4.5 mmol/L (ref 3.5–5.1)
Sodium: 139 mmol/L (ref 136–145)

## 2016-01-12 LAB — CBC W/O DIFF
HCT: 40.7 % — ABNORMAL LOW (ref 42.0–52.0)
HGB: 13.1 g/dL — ABNORMAL LOW (ref 14.0–18.0)
MCH: 27.5 PG (ref 27.0–35.0)
MCHC: 32.2 g/dL (ref 30.7–37.3)
MCV: 85.5 FL (ref 81.0–94.0)
MPV: 9.9 FL (ref 9.2–11.8)
PLATELET: 244 10*3/uL (ref 130–400)
RBC: 4.76 M/uL (ref 4.70–6.00)
RDW: 15 % — ABNORMAL HIGH (ref 11.5–14.0)
WBC: 8.2 10*3/uL (ref 4.8–10.6)

## 2016-01-12 LAB — PTT: aPTT: 32.9 s — ABNORMAL HIGH (ref 21.0–28.0)

## 2016-01-12 LAB — PROTHROMBIN TIME + INR
INR: 2.8 — CR (ref 0.8–1.2)
Prothrombin time: 25.8 s — ABNORMAL HIGH (ref 9.4–11.1)

## 2016-01-12 MED ORDER — ADENOSINE 3 MG/ML IV SOLN
3 mg/mL | Freq: Once | INTRAVENOUS | Status: AC
Start: 2016-01-12 — End: 2016-01-12
  Administered 2016-01-12: 19:00:00 via INTRAVENOUS

## 2016-01-12 MED ORDER — SODIUM CHLORIDE 0.9 % IJ SYRG
INTRAMUSCULAR | Status: DC | PRN
Start: 2016-01-12 — End: 2016-01-12

## 2016-01-12 MED ORDER — BISOPROLOL FUMARATE 5 MG TAB
5 mg | ORAL_TABLET | Freq: Two times a day (BID) | ORAL | 3 refills | Status: DC
Start: 2016-01-12 — End: 2016-02-12

## 2016-01-12 MED ORDER — APIXABAN 5 MG TABLET
5 mg | ORAL_TABLET | Freq: Two times a day (BID) | ORAL | 3 refills | Status: DC
Start: 2016-01-12 — End: 2016-02-12

## 2016-01-12 MED ORDER — SODIUM CHLORIDE 0.9 % IJ SYRG
Freq: Three times a day (TID) | INTRAMUSCULAR | Status: DC
Start: 2016-01-12 — End: 2016-01-12

## 2016-01-12 MED FILL — BD POSIFLUSH NORMAL SALINE 0.9 % INJECTION SYRINGE: INTRAMUSCULAR | Qty: 10

## 2016-01-12 NOTE — Interval H&P Note (Signed)
H&P Update:  Bobby Guzman was seen and examined.  History and physical has been reviewed. Significant clinical changes have occurred as noted:  Adenosine push revealed underlying rhythm as sinus. Increased bisoprolol and given instructions on changing to eliquis as previously documented. TEE/CV cancelled. Followup scheduled in March.    Signed By: Biagio Quint, MD     January 13, 2016 3:27 PM

## 2016-01-12 NOTE — Consults (Signed)
Consult Note   [facility] Heart and Vascular [facility] Electrophysiology     Patient: Bobby Guzman Date of Birth: 10-Feb-1961   MRN: [id number]    Date of Service: 01/12/2016 Age: 55 y.o.   Sex: male     Referring Physician: Dr. Ida Rogue    CHIEF COMPLAINT:   This is a 55 y.o. year old male with possible atrial flutter.    HISTORY OF PRESENT ILLNESS:  The patient has history of lung Ca s/p LUL lobectomy and chemo 2011.  Subsequently developed endocarditis with a perforated anterior mitral leaflet that was treated but no surgical intervention.  However had progressive dyspnea and work-up with TEE 02/2015 revealed preserved LV function with a perforated anterior mitral leaflet and severe MR. Underwent mitral valve repair, subaortic membrane resection, and septal myomectomy at [facility]. [facility] 11/16/15. Post-operatively with some trouble weaning as paroxysmal AF. Placed on amiodarone as well as Coumadin for anticoagulation. Post-operatively had a new LBBB    In f/u, Echo post-op 12/13/15 showed preserved LV function and mitral annuloplasty ring with relatively borderline elevated gradient of 29mHg and trace MR. Holter 12/13/15 showed sinus rhythm, average 75bpm, ranging 60-98bpm, did not reveal significant arrhythmias On 12/21/15 seen in office and thought to be in sinus but with elevated heart rate.  Amiodarone was stopped.    He has been having persistent DOE at one block.  No clear orthopnea or PND.  Also reprots atypical shooting chest pain, nonexertional, lasting only a second.  Initially multiple times per day and in past 2 weeks only once or twice.  Also has upper chest pain with sneezing. Has been having scant nonproductive cough. No palpitations.  Occasional lightheadedness 20-30 min was daily before surgery and now rare.  No syncope.    Seen by cardiologist yeaterday and noted to have HR of ~96bpm. Patient also reported HR on apple watch almost always 96bpm. ECG was suspicious  for AT/FL with 2:1 conduction. Leg lifts performed with no change in HR and then carotid massage and valsalva did not demonstrate any AVB so could not be clarified.  Decision made to bring into the hospital for possible TEE/DCCV.    Has been maintained on Coumadin since surgery (12/20/15 INR was 1.8 but otherwise has been therapeutic since 12/08/15.    PAST MEDICAL/SURGICAL HISTORY  Past Medical History   Diagnosis Date   ??? [place]    ??? [facility]ocarditis [facility]     [facility]   ??? Essential hypertension    ??? Hyperlipidemia    ??? Lung cancer ([place] 2011     s/p LUL resection and chemo   ??? [place]    ??? Sleep apnea    ??? Ulcerative colitis ([facility]         Past Surgical History   Procedure Laterality Date   ??? Hx other surgical  2011     LUL resection   ??? Pr chest surgery procedure unlisted       left upper lobectomy   ??? Hx heart catheterization     ??? Hx mitral valvuloplasty  11/16/2015     Mit[facility]      OUTPATIENT CARDIAC MEDICATIONS:  Bisoprolol 5, coumadin, crestor 10, atacand 4    ALLERGIES: No Known Allergies    SOCIAL HISTORY: The patient reports that he quit smoking about 4  years ago. His smoking use included Cigarettes. He has a 68.00 pack-year smoking history. He has never used smokeless tobacco. He reports that he drinks alcohol. He reports that he does not use illicit drugs.    FAMILY HISTORY: family history includes Heart Attack in his father; High Cholesterol in his brother and sister; Hypertension in his brother and sister; Stroke in his mother. Cousing siwht MIs in 30s-40s    REVIEW OF SYSTEMS:  Pertinent positives and negatives as per HPI.  Denies fever, chills, sweat, weight change, hemoptysis, cough, abdominal pain, N/V, diarrhea, hematuria, bleeding, easy bruising.  All other systems reviewed and are negative.    PHYSICAL EXAM:   There were no vitals taken for this visit.  General: No acute distress  Eyes: Normal appearing conjunctivae and lids  ENT: Normal ears, nose, throat  Neck: No JVD, no carotid bruit  CV: RRR, normal S1 S2, no murmurs, gallops, rubs  Lungs: CTA bilaterally  Abd: Soft, NT, ND, bowel sounds present  Extremities: No edema  Skin: Warm and dry  Neuro: Awake, alert, and oriented   Psych: Appropriate mood and affect    LABS AND TESTING reviewed by me:  Cardiac catheterization 11/02/15 normal coronaries, normal EF, 3+MR, CO 5 L/min. RAP 14/mPAP 39/PCWP 26/LVEDP 21    ECG 01/11/16 Sinus vs AT/FL with 2:1 AV conduction, HR 95bpm, Left axis, LBBB  ECG 12/21/15 Sinus vs AT/AFL with 2:1 AV conduction, HR 97bpm, borderline left axis, LBBB  ECG 11/23/15 Ectopic atrial rhythm @ 60bpm, normal axis, LBBB  ECG 10/17/15 Sinus'@80'$ , normal axis, PRWP, no significant repol changes    Labs 11/29/16 K 4.5  Cr 1.16  LFTs ok  A1c 7.3  TSH 2.2  INR 01/03/16 2.4  01/10/16 2.8  Labs 01/12/16  INR 2.8  K 4.5  Cr 1.28      ASSESSMENT AND PLAN:    55 y.o. male with HTN, DM, HLD, OSA on CPAP, obesity, Lung CA s/p LUL lobectomy and chemo 2011, endocarditis with a perforated anterior mitral leaflet with progression to symptomatic severe MR s/p MV repair, subaortic membrane resection, and septal [facility]omectomy at [facility] 11/16/15 c/b post-operative paroxysmal AF treated with amiodarone and Coumadin and noted to have a new LBBB.      Has been having steady HR in the mid 90s with a question of an AT/AFL with 2:1 AV conduction which might be contributing to his symptoms.  Brou4ght in today for possible TEE/DCCV if having an atrial arrhythmia.  I administered adenosine '6mg'$  with no AV block and then '12mg'$  administered with AV block which did not demonstrate an underlying atrial tachyarrhythmia but just appears to be sinus in the 90s.    -TEE/DCCV cancelled  -Continue anticoagulation as per cardiology  -Continue other CV meds per cardiology  -F/u with EP as needed.     Thank you for allowing me to participate in the care of this patient.    -Eather Colas, MD

## 2016-01-12 NOTE — Progress Notes (Signed)
Patient seen and case reviewed with EP. Adenosine push revealed what appears to be sinus rhythm -- TEE/CV canceled.    Plan:  -Clear to travel to [place] to Eliquis 5 BID (hold 1/27 and 1/28 doses coumadin, start eliquis 1/29)  -Increase bisoprolol to 5 BID  -Continue other meds  -Keep followup with me in March

## 2016-01-14 LAB — EKG, 12 LEAD, INITIAL
Atrial Rate: 93 {beats}/min
Calculated P Axis: 32 degrees
Calculated R Axis: -32 degrees
Calculated T Axis: 95 degrees
Diagnosis: NORMAL
P-R Interval: 180 ms
Q-T Interval: 402 ms
QRS Duration: 156 ms
QTC Calculation (Bezet): 499 ms
Ventricular Rate: 93 {beats}/min

## 2016-02-12 ENCOUNTER — Telehealth

## 2016-02-12 MED ORDER — METFORMIN 1,000 MG TAB
1000 mg | ORAL_TABLET | ORAL | 1 refills | Status: DC
Start: 2016-02-12 — End: 2016-06-25

## 2016-02-12 MED ORDER — ROSUVASTATIN 10 MG TAB
10 mg | ORAL_TABLET | Freq: Every evening | ORAL | 1 refills | Status: DC
Start: 2016-02-12 — End: 2016-03-05

## 2016-02-12 MED ORDER — CANDESARTAN 4 MG TAB
4 mg | ORAL_TABLET | Freq: Every day | ORAL | 1 refills | Status: DC
Start: 2016-02-12 — End: 2016-03-05

## 2016-02-12 MED ORDER — BISOPROLOL FUMARATE 5 MG TAB
5 mg | ORAL_TABLET | Freq: Two times a day (BID) | ORAL | 3 refills | Status: DC
Start: 2016-02-12 — End: 2016-03-05

## 2016-02-12 MED ORDER — APIXABAN 5 MG TABLET
5 mg | ORAL_TABLET | Freq: Two times a day (BID) | ORAL | 3 refills | Status: DC
Start: 2016-02-12 — End: 2016-03-05

## 2016-02-12 NOTE — Telephone Encounter (Signed)
Refill    rosuvastatin (CRESTOR) 10 mg tablet   - son wants CRESTOR not the generic    - states the generic wasn't working right    candesartan (ATACAND) 4 mg tablet  apixaban (ELIQUIS) 5 mg tablet  bisoprolol (ZEBETA) 5 mg tablet  metFORMIN (GLUCOPHAGE) 1,000 mg tablet    Pharmacy: [facility] // [address]  [phone]

## 2016-03-05 ENCOUNTER — Encounter

## 2016-03-05 MED ORDER — APIXABAN 5 MG TABLET
5 mg | ORAL_TABLET | Freq: Two times a day (BID) | ORAL | 1 refills | Status: DC
Start: 2016-03-05 — End: 2016-06-25

## 2016-03-05 MED ORDER — ROSUVASTATIN 10 MG TAB
10 mg | ORAL_TABLET | Freq: Every evening | ORAL | 1 refills | Status: DC
Start: 2016-03-05 — End: 2016-06-25

## 2016-03-05 MED ORDER — BISOPROLOL FUMARATE 5 MG TAB
5 mg | ORAL_TABLET | Freq: Two times a day (BID) | ORAL | 1 refills | Status: DC
Start: 2016-03-05 — End: 2016-03-18

## 2016-03-05 MED ORDER — CANDESARTAN 4 MG TAB
4 mg | ORAL_TABLET | Freq: Every day | ORAL | 1 refills | Status: DC
Start: 2016-03-05 — End: 2016-06-25

## 2016-03-14 ENCOUNTER — Encounter: Attending: Cardiovascular Disease | Primary: Internal Medicine

## 2016-03-14 ENCOUNTER — Ambulatory Visit
Admit: 2016-03-14 | Discharge: 2016-03-14 | Payer: PRIVATE HEALTH INSURANCE | Attending: Medical Oncology | Primary: Internal Medicine

## 2016-03-14 DIAGNOSIS — C3412 Malignant neoplasm of upper lobe, left bronchus or lung: Secondary | ICD-10-CM

## 2016-03-14 NOTE — Progress Notes (Signed)
Pt is overall feeling well. He had mitral valve heart surgery at [facility] on 11/16/15 with Dr. Andree Elk. His mediport was removed 11/15/16. His breathing has not worsened. He is trying to loose weight, he has some SOB when exercising but it is slowly improving. No nausea or vomiting. Sometimes his ulcerative colitis acts up. No recent fevers or chills. CT 03/23/15. Pt states he had an MRI and a CT scan at [facility] before his surgery on 11/16/15.

## 2016-03-14 NOTE — Progress Notes (Signed)
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 The patient had CT scan on 03/23/15 as advised.      HISTORY OF PRESENT ILLNESS:  Mr. 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  The patient was last seen by me on October 17, 2015.  He informed me today that she had mitral valve heart surgery at [facility] on 11/16/2015 with Dr. Andree Elk.  His MediPort was removed on 11/15/2016.  He is feeling a lot better since his surgery.  She denies any shortness of breath.  He denies any chest pain.  He denies any hemoptysis.  He is trying to lose weight.  He states that his ulcerative colitis acts up from time to time.  He denies any bright red blood per rectum.  He denies any fevers or chills.  The patient states that he had a CAT scan of the chest and an MRI at [facility] prior to his surgery on November 16, 2015.    As noted on 10/17/2015:  "The patient had CT scan on 03/23/15 as advised. He has not had the mediport removed as advised. He states he was in [place] and was worked up for a GI  Bleed, possible ulcer and had 18 polyps removed during a colonoscopy. He states he was told that the polyps were "almost cancer". I have requested a translated copy of the records. He states he had lost weigh as advised but then gained it back in [place] while he was away on Business. He plans to follow up with his Cardiac Surgeons in [facility] and will discuss removal of the mediport. He has some dyspnea on exertion and an occasional cough. He denies any hemoptysis."    As noted on 03/17/15:  "The patient went for routine checkup and was found to have an abnormality in his left lung on chest x-ray.  This was followed by a CAT scan and further workup leading to surgical resection in [place].  The original pathology report is in  [place].  I have reviewed the translation and the patient had a moderately to well-differentiated squamous cell carcinoma in the left upper lobe excision specimen.  39 lymph nodes were negative for any evidence of metastatic spread: Stage IIB (pT3,pN0,M0).  The patient was not recommended any further adjuvant treatment in [place] but was seen in Medical Oncology consultation in [place] and was advised to have adjuvant chemotherapy.  He was seen in [place] by Dr. Charlott Holler who also recommended adjuvant chemotherapy with Navelbine and Cisplatin as per NCCN guidelines.  This was started on now 09/29/2010 and according to Dr. Ansel Bong notes he had an elevated pretreatment CA-125 at 56 which eventually  decreased to normal.  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  He had an inflamed anal fissure.  The patient travels back and forth between the [place] and [place] on business.  He still has a MediPort which he states he gets flushed once a year.  He has been recommended to have the MediPort removed but informed me that he was waiting for 5 years to be up before he would remove the  MediPort.  He denies any fevers or chills.  The patient has surveillance scans every 6 months and is due for a CAT scan of the chest abdomen and pelvis which was last done in [place] in September 2015 and in [place] in January 2015.    The patient denies any allergies.  He smoked at least 1-1/2-2 packs per day for several years and quit smoking 5 years ago.  He has a history of diabetes mellitus and is on insulin.  He has a history of hypertension and takes Atacand.  He developed mitral valve regurgitation following the episode of endocarditis and has symptoms of dyspnea on exertion.  He is overweight since he stopped smoking and is trying to lose weight.  He has seen Dr. Ida Rogue  from Cardiology and has been recommended to have mitral valve surgery which he plans to have this Fall.  He has a history of ulcerative colitis which has not been a problem since he had chemotherapy.  He has a history of sleep apnea and is known to have elevated lipids.  The patient was born in [place] and has lived in the [place] for the past 25 years.  He states that one half-sister died of some kind of cancer but he is unsure as as to the details.  He has 2 sons who are in good health.    His medications were reviewed."    DX   Encounter Diagnoses   Name Primary?   ??? Primary malignant neoplasm of left upper lobe of lung (HCC) Yes   ??? Shortness of breath         Past Medical History:   Diagnosis Date   ??? Diabetes ([place])    ??? Endocarditis 2012    dx [facility]; mitral valve; MV repair (Dr. Elio Forget [facility]) 11/16/15   ??? Essential hypertension    ??? Hyperlipidemia    ??? Lung cancer ([place]) 2011    s/p LUL resection and chemo   ??? Primary malignant neoplasm of left upper lobe of lung ([place])    ??? Sleep apnea    ??? Ulcerative colitis ([place])       Past Surgical History:   Procedure Laterality Date   ??? CHEST SURGERY PROCEDURE UNLISTED      left upper lobectomy   ??? HX HEART CATHETERIZATION     ??? HX MITRAL VALVULOPLASTY  11/16/2015     Mitral REPAIR and myomectomy with Dr. Andree Elk, [facility]   ??? [place] OTHER SURGICAL  2011    LUL resection      Current Outpatient Prescriptions   Medication Sig   ??? apixaban (ELIQUIS) 5 mg tablet Take 1 Tab by mouth two (2) times a day.   ??? bisoprolol (ZEBETA) 5 mg tablet Take 1 Tab by mouth two (2) times a day.   ??? candesartan (ATACAND) 4 mg tablet Take 1 Tab by mouth daily.   ??? rosuvastatin (CRESTOR) 10 mg tablet Take 1 Tab  by mouth nightly.   ??? metFORMIN (GLUCOPHAGE) 1,000 mg tablet TAKE ONE TAB BY MOUTH TWICE A DAY    **DO NOT RESTART METFORMIN UNTIL Sunday 11/05/15!!!!!!!   ??? insulin glargine (LANTUS) 100 unit/mL injection by SubCUTAneous route nightly.   ??? VICTOZA 3-PAK 0.6 mg/0.1 mL (18 mg/3 mL) sub-q pen 1.8 DAILY 90 DAYS SUPPLY DX:250.62   ??? sub-q insulin device, 40 unit devi by Does Not Apply route.   ??? HYDROmorphone (DILAUDID) 2 mg tablet Take 1 Tab by mouth every six (6) hours as needed for Pain. Max Daily Amount: 8 mg.     No current facility-administered medications for this visit.       No Known Allergies   Social History   Substance Use Topics   ??? Smoking status: Former Smoker     Packs/day: 2.00     Years: 34.00     Types: Cigarettes     Quit date: 02/20/2011   ??? Smokeless tobacco: Never Used      Comment: e- cig no nicotine   ??? Alcohol use 0.0 oz/week     0 Standard drinks or equivalent per week      Comment: 2shots or beers /week      Family History   Problem Relation Age of Onset   ??? Stroke Mother    ??? Heart Attack Father    ??? High Cholesterol Sister    ??? Hypertension Sister    ??? High Cholesterol Brother    ??? Hypertension Brother         Review of Systems:  A detailed 10 organ review of systems is obtained with pertinent positives as listed in the History of Present Illness and Past Medical History. All others are negative.    Physical Exam:  Visit Vitals   ??? BP 116/74   ??? Pulse 80   ??? Temp 97 ??F (36.1 ??C) (Oral)   ??? Ht '5\' 8"'$  (1.727 m)   ??? Wt 258 lb (117 kg)   ??? BMI 39.23 kg/m2        General appearance  WD, WN Caucasian male, alert, cooperative, no distress, appears stated age   Head  Normocephalic, without obvious abnormality, atraumatic   Eyes  PERRLA   Nose Nares normal. Septum midline. Mucosa normal. No drainage or sinus tenderness.   Throat Lips, mucosa, and tongue normal. Teeth and gums normal.  Oropharynx clear.   Neck supple, symmetrical, trachea midline, no adenopathy, thyroid: not enlarged, symmetric, no tenderness/mass/nodules,no JVD   Back   No midline or CVA tenderness   Lungs   clear to auscultation bilaterally   Heart  regular rate and rhythm, S1, S2 normal, note IV/VI holosystolic at apex    Abdomen   soft, obese, non-tender, non-distended. Bowel sounds normal. No masses,  No organomegaly   Extremities extremities normal, atraumatic, no cyanosis or edema   Skin Skin color, texture, turgor normal. No rashes or lesions   Lymph nodes Nonpalpable   Neurologic Nonfocal       Labs:   No results found for this or any previous visit (from the past 24 hour(s)).        Impression and Plan:  The patient appears to be in remission with regards to his Stage II B (pT3,pN0,M0) Squamous cell carcinoma of the Left Upper Lobe s/p Left Upper Lobectomy in July 2011 followed by adjuvant chemotherapy with Navelbine and Cisplatin which was complicated by endocarditis and development of Mitral regurgitation. He is s/p Mitral Valve repair  at [facility] on 11/16/2015.    NCCN surveillance guidelines were reviewed. He informed me that he had a  CT scan of the Chest at [facility] in December 2016.     I have again recommended that he lose weight and modify his diet.        ICD-10-CM ICD-9-CM    1. Primary malignant neoplasm of left upper lobe of lung (HCC) C34.12 162.3    2. Shortness of breath R06.02 786.05      Follow-up Disposition:  Return in about 4 months (around 07/11/2016).  Neysa Hotter, MD  03/14/2016

## 2016-03-14 NOTE — Patient Instructions (Addendum)
Lung Cancer: Care Instructions  Your Care Instructions  Lung cancer occurs when abnormal cells grow out of control in the lung. Lung cancer can start anywhere in the lungs and spread to other parts of the body.  Treatment for lung cancer depends on what type of lung cancer you have and how advanced it is. Treatment may include surgery to remove the cancer. It could also include medicines (chemotherapy) or radiation to destroy cancer cells.  Being treated for cancer can weaken your body, and you may feel very tired. Home treatment and certain medicines can help you feel better.  Finding out that you have cancer is scary. You may feel many emotions and may need some help coping. Seek out family, friends, and counselors for support. You also can do things at home to make yourself feel better while you go through treatment. Call the [facility] [phone]) or visit its website at [facility] for more information.  Follow-up care is a key part of your treatment and safety. Be sure to make and go to all appointments, and call your doctor if you are having problems. It's also a good idea to know your test results and keep a list of the medicines you take.  How can you care for yourself at home?  ?? Take your medicines exactly as prescribed. Call your doctor if you think you are having a problem with your medicine. You will get more details on the specific medicines your doctor prescribes.  ?? Follow your doctor's instructions to relieve pain. Use pain medicine when you first feel pain, before it becomes severe. Taking pain medicines regularly is often the best way to keep pain under control.  ?? Eat healthy food. If you do not feel like eating, try to eat food that has protein and extra calories to keep up your strength and prevent weight loss. Drink liquid meal replacements for extra calories and protein. Try to eat your main meal early. Eating smaller portions more often may help as well.   ?? Get some physical activity every day, but do not get too tired. Keep doing the hobbies you enjoy as your energy allows.  ?? Do not smoke. Smoking can make your cancer symptoms worse. If you need help quitting, talk to your doctor about stop-smoking programs and medicines. These can increase your chances of quitting for good.  ?? If you use oxygen, do not smoke, light a cigarette, or use a flame while your oxygen is on. Smoking while using oxygen can lead to fire and even explosion.  ?? If you have nausea, try to eat several small meals a day. When you feel better, eat clear soups and mild foods until all symptoms are gone for 12 to 48 hours. Other good choices include dry toast, crackers, cooked cereal, and gelatin dessert, such as Jell-O.  ?? If you are vomiting or have diarrhea:  ?? Drink plenty of fluids (enough so that your urine is light yellow or clear like water) to prevent dehydration. Choose water and other caffeine-free clear liquids. If you have kidney, heart, or liver disease and have to limit fluids, talk with your doctor before you increase the amount of fluids you drink.  ?? When you are able to eat, try clear soups, mild foods, and liquids until all symptoms are gone for 12 to 48 hours. Other good choices include dry toast, crackers, cooked cereal, and gelatin dessert, such as Jell-O.  ?? Take steps to control your stress and workload. Learn  relaxation techniques.  ?? Share your feelings. Stress and tension affect our emotions. By expressing your feelings to others, you may be able to understand and cope with them.  ?? Consider joining a support group. Talking about a problem with your spouse, a good friend, or other people with similar problems is a good way to reduce tension and stress.  ?? Express yourself with art. Try writing, crafts, dance, or art to relieve stress. Some dance, writing, or art groups may be available just for people who have cancer.   ?? Be kind to your body and mind. Getting enough sleep, eating a healthy diet, and taking time to do things you enjoy can contribute to an overall feeling of balance in your life and help reduce stress.  ?? Get help if you need it. Discuss your concerns with your doctor or counselor.  ?? If you have not already done so, prepare a list of advance directives. Advance directives are instructions to your doctor and family members about what kind of care you want if you become unable to speak or express yourself.  When should you call for help?  Call 911 anytime you think you may need emergency care. For example, call if:  ?? You have sudden or severe chest pain.  ?? You have severe trouble breathing.  ?? You cough up a lot of blood.  ?? You vomit and feel like you may faint when you sit up or stand.  Call your doctor now or seek immediate medical care if:  ?? You are short of breath.  ?? You have new chest pain.  ?? You have a fever.  ?? Your neck and face swell.  ?? You have unexpected or severe nausea or vomiting.  ?? Your pain is not controlled by medicine.  ?? Your symptoms get worse or are not getting better.  Watch closely for changes in your health, and be sure to contact your doctor if:  ?? You develop a new cough, or your cough does not go away.  ?? You cough up yellow or green mucus for longer than 2 days.  ?? You are constipated or have diarrhea.  Where can you learn more?  Go to [facility].  Enter [phone] in the search box to learn more about "Lung Cancer: Care Instructions."  Current as of: July 11, 2015  Content Version: 11.2  ?? 2006-2017 [employer]wise, Incorporated. Care instructions adapted under license by [employer] (which disclaims liability or warranty for this information). If you have questions about a medical condition or this instruction, always ask your healthcare professional. [place] any warranty or liability for your use of this information.

## 2016-03-18 ENCOUNTER — Ambulatory Visit
Admit: 2016-03-18 | Discharge: 2016-03-18 | Payer: PRIVATE HEALTH INSURANCE | Attending: Cardiovascular Disease | Primary: Internal Medicine

## 2016-03-18 DIAGNOSIS — I480 Paroxysmal atrial fibrillation: Secondary | ICD-10-CM

## 2016-03-18 NOTE — Progress Notes (Signed)
03/18/2016     Patient:  Bobby Guzman   DOB:  1961-05-08       CHIEF COMPLAINT:   Chief Complaint   Patient presents with   ??? Irregular Heart Beat     2 month follow up.    ??? Shortness of Breath   ??? Obesity         HISTORY OF PRESENT ILLNESS: Bobby Guzman presents for cardiology follow up. Going to [place] x 3 months. SOB improved over time while he was in [place] -- not completely resolved. Has had intermittent cough and possible wheeze. Denies CP, dizziness, or palps.    PAST MEDICAL HISTORY:  Past Medical History:   Diagnosis Date   ??? Diabetes ([place])    ??? Endocarditis 2012    dx [facility]; mitral valve; MV repair (Dr. Elio Forget [facility]) 11/16/15   ??? Essential hypertension    ??? Hyperlipidemia    ??? Lung cancer ([place]) 2011    s/p LUL resection and chemo   ??? Primary malignant neoplasm of left upper lobe of lung ([place])    ??? Sleep apnea    ??? Ulcerative colitis ([place])         PAST SURGICAL HISTORY:  Past Surgical History:   Procedure Laterality Date   ??? CHEST SURGERY PROCEDURE UNLISTED      left upper lobectomy   ??? HX HEART CATHETERIZATION     ??? HX MITRAL VALVULOPLASTY  11/16/2015    Mitral REPAIR and myomectomy with Dr. Andree Elk, [facility]   ??? [place] OTHER SURGICAL  2011    LUL resection       MEDICATIONS:  Current Outpatient Prescriptions   Medication Sig Dispense Refill   ??? bisoprolol (ZEBETA) 5 mg tablet Take 5 mg by mouth daily.     ??? aspirin delayed-release 81 mg tablet Take 81 mg by mouth daily.     ??? insulin degludec (TRESIBA FLEXTOUCH U-100) 100 unit/mL (3 mL) inpn by SubCUTAneous route.     ??? apixaban (ELIQUIS) 5 mg tablet Take 1 Tab by mouth two (2) times a day. 90 Tab 1   ??? candesartan (ATACAND) 4 mg tablet Take 1 Tab by mouth daily. 90 Tab 1   ??? rosuvastatin (CRESTOR) 10 mg tablet Take 1 Tab by mouth nightly. 90 Tab 1   ??? metFORMIN (GLUCOPHAGE) 1,000 mg tablet TAKE ONE TAB BY MOUTH TWICE A DAY    **DO NOT RESTART METFORMIN UNTIL Sunday 11/05/15!!!!!!! 180 Tab 1    ??? VICTOZA 3-PAK 0.6 mg/0.1 mL (18 mg/3 mL) sub-q pen 1.8 DAILY 90 DAYS SUPPLY DX:250.62  1   ??? sub-q insulin device, 40 unit devi by Does Not Apply route.          ALLERGIES:   No Known Allergies    SOCIAL HISTORY:  Social History     Social History   ??? Marital status: MARRIED     Spouse name: N/A   ??? Number of children: N/A   ??? Years of education: N/A     Social History Main Topics   ??? Smoking status: Former Smoker     Packs/day: 2.00     Years: 34.00     Types: Cigarettes     Quit date: 02/20/2011   ??? Smokeless tobacco: Never Used      Comment: e- cig no nicotine   ??? Alcohol use 0.0 oz/week     0 Standard drinks or equivalent per week      Comment: 2shots or  beers /week   ??? Drug use: No   ??? Sexual activity: Not Asked     Other Topics Concern   ??? None     Social History Narrative         FAMILY HISTORY:  Family History   Problem Relation Age of Onset   ??? Stroke Mother    ??? Heart Attack Father    ??? High Cholesterol Sister    ??? Hypertension Sister    ??? High Cholesterol Brother    ??? Hypertension Brother        REVIEW OF SYSTEMS:  Neuro: No headache, focal weakness/numbness, parasthesias  Eyes: No visual disturbance  ENMT: No soar throat, nasal congestion, hearing loss, tinnitus  CV: No chest pain, palpitations, dizziness, syncope, claudication  Resp: dyspnea  GI: No abdominal pain, N/V, diarrhea, constipation  GU: No hematuria, dysuria  Skin: No rash  Heme: No bleeding, bruising  Constitutional: No fatigue, weakness, fever, weight change  Musculoskeletal: No myalgias, arthralgias  Other:    PHYSICAL EXAM:  Blood pressure 122/80, pulse 65, height '5\' 8"'$  (1.727 m), weight 260 lb (117.9 kg), SpO2 97 %.   General: Well developed, well nourished, no acute distress, appears stated age  Eyes: Anicteric, no hemorrhage, normal appearing conjunctivae and lids  ENMT: Normal externally  Neck: Supple, no JVD, no carotid bruit  CV: RRR, normal S1S2, no M/G/R  Lungs: CTA bilaterally, normal respiratory excursion   Abd: Soft, NT, ND, no palpable mass  Extremities: No pedal edema  Skin: Warm and dry  Musculoskeletal: Normal muscle tone, normal gait  Neuro: Nonfocal exam, no rigidity/tremor  Psych: Alert and oriented, appropriate affect      LABS AND TESTING:  ?? EKG: NSR, LBBB  ?? TEE 01/24/11 - nl LVEF, vegetations on both MV leaflets, moderate MR  ?? ABI/PVR 03/06/15 - nl  ?? TEE 03/07/15 - nl LV/RV size/fxn, anterior MV leaflet with rupture, severe MR  ?? Cath 11/02/15 - no significant CAD, severe MR, LVEDP 21  ?? CXR 11/23/15 - cardiomegaly, L pleural effusion, linear streaking R base  ?? Echo 12/13/15 - moderate LVH, LVEF 50-55, severe LAE, nl RV, MV annuloplasty ring with trace MR  ?? 24 hr holter 12/13/15 - NSR 60-98 (avg 75), IVCD, occasional PVC, NSVT vs SVT (more likely) x 1 for 13 beats, no AF    ASSESSMENT AND PLAN:    1. HTN - Blood pressure is well controlled. Continue current antihypertensive regimen.  2. Dyslipidemia - Recent lipids unknown. Continue current medications and follow up labs.  3. Hx endocarditis and subsequent MV rupture with severe MR - S/p MV repair 11/16/15. Clinically stable in this regard with postop echo as above. Plan on serial echo and dental endocarditis ppx going forward.  4. Atrial fibrillation - Paroxysmal and brief postop. Continue eliquis (discussed and patient understands that this is technically off label given his MV repair) for now. CHADS-VASc 1 -- will consider monitor and [place] AC at followup.    Biagio Quint, MD

## 2016-06-25 ENCOUNTER — Telehealth

## 2016-06-25 ENCOUNTER — Ambulatory Visit
Admit: 2016-06-25 | Discharge: 2016-06-25 | Payer: PRIVATE HEALTH INSURANCE | Attending: Cardiovascular Disease | Primary: Internal Medicine

## 2016-06-25 DIAGNOSIS — I480 Paroxysmal atrial fibrillation: Secondary | ICD-10-CM

## 2016-06-25 MED ORDER — METFORMIN 1,000 MG TAB
1000 mg | ORAL_TABLET | ORAL | 1 refills | Status: DC
Start: 2016-06-25 — End: 2020-12-26

## 2016-06-25 MED ORDER — APIXABAN 5 MG TABLET
5 mg | ORAL_TABLET | Freq: Two times a day (BID) | ORAL | 1 refills | Status: DC
Start: 2016-06-25 — End: 2016-12-27

## 2016-06-25 MED ORDER — ROSUVASTATIN 10 MG TAB
10 mg | ORAL_TABLET | Freq: Every evening | ORAL | 1 refills | Status: DC
Start: 2016-06-25 — End: 2016-06-26

## 2016-06-25 MED ORDER — CANDESARTAN 4 MG TAB
4 mg | ORAL_TABLET | Freq: Every day | ORAL | 1 refills | Status: DC
Start: 2016-06-25 — End: 2016-12-27

## 2016-06-25 NOTE — Progress Notes (Signed)
06/25/2016     Patient:  Bobby Guzman   DOB:  02-24-1961       CHIEF COMPLAINT:   Chief Complaint   Patient presents with   ??? Irregular Heart Beat         HISTORY OF PRESENT ILLNESS: Daltyn Maffeo presents for cardiology follow up. Mild DOE unchanged. Denies CP. Denies palpitations.    Has had transient episodes of dizziness a/w low BP. Also had separate episodes of nausea, dizziness, and visual disturbance -- 2 episodes in the past few months.     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 Also usually spends part of winter in [place].    PAST MEDICAL HISTORY:  Past Medical History:   Diagnosis Date   ??? Diabetes ([place])    ??? Endocarditis 2012    dx [facility]; mitral valve; MV repair (Dr. Elio Forget [facility]) 11/16/15   ??? Essential hypertension    ??? Hyperlipidemia    ??? Lung cancer ([place]) 2011    s/p LUL resection and chemo   ??? Primary malignant neoplasm of left upper lobe of lung ([place])    ??? Sleep apnea    ??? Ulcerative colitis ([place])         PAST SURGICAL HISTORY:  Past Surgical History:   Procedure Laterality Date   ??? CHEST SURGERY PROCEDURE UNLISTED      left upper lobectomy   ??? HX HEART CATHETERIZATION     ??? HX MITRAL VALVULOPLASTY  11/16/2015    Mitral REPAIR and myomectomy with Dr. Andree Elk, [facility]   ??? HX OTHER SURGICAL  2011    LUL resection       MEDICATIONS:  Current Outpatient Prescriptions   Medication Sig Dispense Refill   ??? bisoprolol (ZEBETA) 5 mg tablet Take 5 mg by mouth daily.     ??? insulin degludec (TRESIBA FLEXTOUCH U-100) 100 unit/mL (3 mL) inpn by SubCUTAneous route.     ??? apixaban (ELIQUIS) 5 mg tablet Take 1 Tab by mouth two (2) times a day. 90 Tab 1   ??? candesartan (ATACAND) 4 mg tablet Take 1 Tab by mouth daily. 90 Tab 1   ??? rosuvastatin (CRESTOR) 10 mg tablet Take 1 Tab by mouth nightly. 90 Tab 1   ??? metFORMIN (GLUCOPHAGE) 1,000 mg tablet TAKE ONE TAB BY MOUTH TWICE A DAY    **DO NOT RESTART METFORMIN UNTIL Sunday 11/05/15!!!!!!! 180 Tab 1    ??? VICTOZA 3-PAK 0.6 mg/0.1 mL (18 mg/3 mL) sub-q pen 1.8 DAILY 90 DAYS SUPPLY DX:250.62  1   ??? sub-q insulin device, 40 unit devi by Does Not Apply route.          ALLERGIES:   No Known Allergies    SOCIAL HISTORY:  Social History     Social History   ??? Marital status: MARRIED     Spouse name: N/A   ??? Number of children: N/A   ??? Years of education: N/A     Social History Main Topics   ??? Smoking status: Former Smoker     Packs/day: 2.00     Years: 34.00     Types: Cigarettes     Quit date: 02/20/2011   ??? Smokeless tobacco: Never Used      Comment: e- cig no nicotine   ??? Alcohol use 0.0 oz/week     0 Standard drinks or equivalent per week      Comment: 2shots or beers /week   ???  Drug use: No   ??? Sexual activity: Not Asked     Other Topics Concern   ??? None     Social History Narrative         FAMILY HISTORY:  Family History   Problem Relation Age of Onset   ??? Stroke Mother    ??? Heart Attack Father    ??? High Cholesterol Sister    ??? Hypertension Sister    ??? High Cholesterol Brother    ??? Hypertension Brother        REVIEW OF SYSTEMS:  Neuro: No headache, focal weakness/numbness, parasthesias  Eyes: visual disturbance  ENMT: No soar throat, nasal congestion, hearing loss, tinnitus  CV: No chest pain, palpitations, syncope, claudication  Resp: dyspnea  GI: No abdominal pain, N/V, diarrhea, constipation  GU: No hematuria, dysuria  Skin: No rash  Heme: No bleeding, bruising  Constitutional: No fatigue, weakness, fever, weight change  Musculoskeletal: No myalgias, arthralgias  Other:    PHYSICAL EXAM:  Blood pressure 118/78, pulse 84, height '5\' 8"'$  (1.727 m), weight 254 lb (115.2 kg), SpO2 98 %.   General: Well developed, well nourished, no acute distress, appears stated age  Eyes: Anicteric, no hemorrhage, normal appearing conjunctivae and lids  ENMT: Normal externally  Neck: Supple, no JVD, no carotid bruit  CV: RRR, normal S1S2, no M/G/R  Lungs: CTA bilaterally, normal respiratory excursion  Abd: Soft, NT, ND, no palpable mass   Extremities: No pedal edema  Skin: Warm and dry  Musculoskeletal: Normal muscle tone, normal gait  Neuro: Nonfocal exam, no rigidity/tremor  Psych: Alert and oriented, appropriate affect      LABS AND TESTING:  ?? EKG: NSR, LAE, LBBB  ?? Labs: 06/19/16 - TSH nl, A1c 6.6, CMP nl (x gluc 164), LDL 64, HDL 27, TG 269, CBC nl  ?? ABI/PVR 03/06/15 - nl  ?? TEE 03/07/15 - nl LV/RV size/fxn, anterior MV leaflet with rupture, severe MR  ?? Cath 11/02/15 - no significant CAD, severe MR, LVEDP 21  ?? Echo 12/13/15 - moderate LVH, LVEF 50-55, severe LAE, nl RV, MV annuloplasty ring with trace MR  ?? 24 hr holter 12/13/15 - NSR 60-98 (avg 75), IVCD, occasional PVC, NSVT vs SVT (more likely) x 1 for 13 beats, no AF    ASSESSMENT AND PLAN:    1. HTN - Blood pressure is well controlled. Continue current antihypertensive regimen.  2. Dyslipidemia - Lipids are abnormal as described. LDL controlled. Continue crestor. Diet and exercise discussed.    3. Hx endocarditis and subsequent MV rupture with severe MR - S/p MV repair 11/16/15. Clinically stable in this regard with postop echo as above. Plan on serial echo and dental endocarditis ppx going forward.  4. Atrial fibrillation - Paroxysmal and brief postop. Continue eliquis (discussed and patient understands that this is technically off label given his MV repair) for now. CHADS-VASc 1 -- will consider monitor and DC'ing AC at followup (continue for now until MRI brain given recent near syncopal episode with visual disturbance).  5. Near syncope - His description is more consistent with vasovagal event. MRI brain planned -- will followup. Consider event monitor (difficult to arrange given his frequent travel). Continue AC for now as above.    [name], MD

## 2016-06-25 NOTE — Telephone Encounter (Signed)
90 DAYS SUPPLY    apixaban (ELIQUIS) 5 mg tablet  candesartan (ATACAND) 4 mg tablet  metFORMIN (GLUCOPHAGE) 1,000 mg tablet  rosuvastatin (CRESTOR) 10 mg tablet    [facility]

## 2016-06-26 ENCOUNTER — Telehealth

## 2016-06-26 MED ORDER — ROSUVASTATIN 10 MG TAB
10 mg | ORAL_TABLET | Freq: Every evening | ORAL | 1 refills | Status: DC
Start: 2016-06-26 — End: 2016-12-27

## 2016-06-26 NOTE — Telephone Encounter (Signed)
90 days supply      rosuvastatin (CRESTOR) 10 mg tablet  Brand name only    [facility]

## 2016-06-27 ENCOUNTER — Encounter: Attending: Medical Oncology | Primary: Internal Medicine

## 2016-12-27 ENCOUNTER — Encounter

## 2016-12-27 MED ORDER — APIXABAN 5 MG TABLET
5 mg | ORAL_TABLET | Freq: Two times a day (BID) | ORAL | 1 refills | Status: DC
Start: 2016-12-27 — End: 2017-04-11

## 2016-12-27 MED ORDER — BISOPROLOL FUMARATE 5 MG TAB
5 mg | ORAL_TABLET | Freq: Every day | ORAL | 1 refills | Status: DC
Start: 2016-12-27 — End: 2018-03-04

## 2016-12-27 MED ORDER — CANDESARTAN 4 MG TAB
4 mg | ORAL_TABLET | Freq: Every day | ORAL | 1 refills | Status: DC
Start: 2016-12-27 — End: 2017-04-11

## 2016-12-27 MED ORDER — ROSUVASTATIN 10 MG TAB
10 mg | ORAL_TABLET | Freq: Every evening | ORAL | 1 refills | Status: DC
Start: 2016-12-27 — End: 2016-12-31

## 2016-12-27 NOTE — Telephone Encounter (Signed)
CALL SON GEORGE AS FATHER IS OVERSEAS HIS NUMBER IS [phone]. HE IS SELF MEDICATING THINKING HE SHOULD STOP SOME MEDS RX'D AND CUTTING DOSES IN HALF. PLEASE CALL SON TO DISCUSS.

## 2016-12-31 MED ORDER — ATORVASTATIN 20 MG TAB
20 mg | ORAL_TABLET | Freq: Every day | ORAL | 2 refills | Status: DC
Start: 2016-12-31 — End: 2017-04-11

## 2016-12-31 NOTE — Telephone Encounter (Signed)
PATIENT'S SON, GEORGE IS FOLLOWING UP ON CRESTOR RX. [facility] NEEDS ADDITIONAL INFORMATION TO PROCESS REQUEST FOR A 90 DAY SUPPLY 10 MGS. PLEASE ADVISE.

## 2016-12-31 NOTE — Telephone Encounter (Signed)
Prior auth for Crestor denied by insurance. MD aware, will switch to preferred.

## 2017-04-11 ENCOUNTER — Ambulatory Visit
Admit: 2017-04-11 | Discharge: 2017-04-11 | Payer: PRIVATE HEALTH INSURANCE | Attending: Cardiovascular Disease | Primary: Internal Medicine

## 2017-04-11 DIAGNOSIS — I480 Paroxysmal atrial fibrillation: Secondary | ICD-10-CM

## 2017-04-11 MED ORDER — ROSUVASTATIN 20 MG TAB
20 mg | ORAL_TABLET | Freq: Every evening | ORAL | 3 refills | Status: DC
Start: 2017-04-11 — End: 2018-01-23

## 2017-04-11 MED ORDER — CANDESARTAN 4 MG TAB
4 mg | ORAL_TABLET | Freq: Every day | ORAL | 3 refills | Status: DC
Start: 2017-04-11 — End: 2018-01-23

## 2017-04-11 NOTE — Progress Notes (Signed)
04/11/2017     Patient:  Gracelyn Nurse   DOB:  1961/05/31       CHIEF COMPLAINT:   Chief Complaint   Patient presents with   ??? Irregular Heart Beat     follow up          HISTORY OF PRESENT ILLNESS: Jeter Melling presents for cardiology follow up. DOE unchanged. Denies CP, diz, palps.    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 Also usually spends part of winter in [place].    PAST MEDICAL HISTORY:  Past Medical History:   Diagnosis Date   ??? Diabetes ([place])    ??? Endocarditis 2012    dx [facility]; mitral valve; MV repair (Dr. Elio Forget [facility]) 11/16/15   ??? Essential hypertension    ??? Hyperlipidemia    ??? Lung cancer ([place]) 2011    s/p LUL resection and chemo   ??? Primary malignant neoplasm of left upper lobe of lung ([place])    ??? Sleep apnea    ??? Ulcerative colitis ([place])         PAST SURGICAL HISTORY:  Past Surgical History:   Procedure Laterality Date   ??? CHEST SURGERY PROCEDURE UNLISTED      left upper lobectomy   ??? HX HEART CATHETERIZATION     ??? HX MITRAL VALVULOPLASTY  11/16/2015    Mitral REPAIR and myomectomy with Dr. Andree Elk, [facility]   ??? HX OTHER SURGICAL  2011    LUL resection       MEDICATIONS:  Current Outpatient Prescriptions   Medication Sig Dispense Refill   ??? rosuvastatin (CRESTOR) 10 mg tablet Take 10 mg by mouth nightly.     ??? apixaban (ELIQUIS) 5 mg tablet Take 1 Tab by mouth two (2) times a day. 180 Tab 1   ??? candesartan (ATACAND) 4 mg tablet Take 0.5 Tabs by mouth daily. 45 Tab 1   ??? bisoprolol (ZEBETA) 5 mg tablet Take 0.5 Tabs by mouth daily. (Patient taking differently: Take 5 mg by mouth daily.) 45 Tab 1   ??? metFORMIN (GLUCOPHAGE) 1,000 mg tablet TAKE ONE TAB BY MOUTH TWICE A DAY    **DO NOT RESTART METFORMIN UNTIL Sunday 11/05/15!!!!!!! 180 Tab 1   ??? insulin degludec (TRESIBA FLEXTOUCH U-100) 100 unit/mL (3 mL) inpn by SubCUTAneous route.     ??? VICTOZA 3-PAK 0.6 mg/0.1 mL (18 mg/3 mL) sub-q pen 1.8 DAILY 90 DAYS SUPPLY DX:250.62  1    ??? sub-q insulin device, 40 unit devi by Does Not Apply route.          ALLERGIES:   No Known Allergies    SOCIAL HISTORY:  Social History     Social History   ??? Marital status: MARRIED     Spouse name: N/A   ??? Number of children: N/A   ??? Years of education: N/A     Social History Main Topics   ??? Smoking status: Former Smoker     Packs/day: 2.00     Years: 34.00     Types: Cigarettes     Quit date: 02/20/2011   ??? Smokeless tobacco: Never Used      Comment: e- cig no nicotine   ??? Alcohol use 0.0 oz/week     0 Standard drinks or equivalent per week      Comment: 2shots or beers /week   ??? Drug use: No   ??? Sexual activity: Not Asked     Other Topics  Concern   ??? None     Social History Narrative         FAMILY HISTORY:  Family History   Problem Relation Age of Onset   ??? Stroke Mother    ??? Heart Attack Father    ??? High Cholesterol Sister    ??? Hypertension Sister    ??? High Cholesterol Brother    ??? Hypertension Brother        REVIEW OF SYSTEMS:  Neuro: No headache, focal weakness/numbness, parasthesias  Eyes: No visual disturbance  ENMT: No soar throat, nasal congestion, hearing loss, tinnitus  CV: No chest pain, palpitations, dizziness, syncope, claudication  Resp: No dyspnea, cough  GI: No abdominal pain, N/V, diarrhea, constipation  GU: No hematuria, dysuria  Skin: No rash  Heme: No bleeding, bruising  Constitutional: No fatigue, weakness, fever, weight change  Musculoskeletal: No myalgias, arthralgias  Other:    PHYSICAL EXAM:  Blood pressure 110/68, pulse 71, resp. rate 17, height '5\' 8"'$  (1.727 m), weight 260 lb (117.9 kg), SpO2 96 %.   General: Well developed, well nourished, no acute distress, appears stated age  Eyes: Anicteric, no hemorrhage, normal appearing conjunctivae and lids  ENMT: Normal externally  Neck: Supple, no JVD, no carotid bruit  CV: RRR, normal S1S2, no M/G/R  Lungs: CTA bilaterally, normal respiratory excursion  Abd: Soft, NT, ND, no palpable mass  Extremities: No pedal edema  Skin: Warm and dry   Musculoskeletal: Normal muscle tone, normal gait  Neuro: Nonfocal exam, no rigidity/tremor  Psych: Alert and oriented, appropriate affect      LABS AND TESTING:  ?? EKG: NSR, LBBB (unchanged)   ?? Labs: 04/09/17 - CMP nl (gluc 130), LDL 95, HDL 29, TG 178, TSH nl, CBC nl, A1c 7.6  ?? ABI/PVR 03/06/15 - nl  ?? TEE 03/07/15 - nl LV/RV size/fxn, anterior MV leaflet with rupture, severe MR  ?? Cath 11/02/15 - no significant CAD, severe MR, LVEDP 21  ?? Echo 12/13/15 - moderate LVH, LVEF 50-55, severe LAE, nl RV, MV annuloplasty ring with trace MR  ?? 24 hr holter 12/13/15 - NSR 60-98 (avg 75), IVCD, occasional PVC, NSVT vs SVT (more likely) x 1 for 13 beats, no AF    ASSESSMENT AND PLAN:    1. HTN - Blood pressure is well controlled. Continue current antihypertensive regimen.  2. Dyslipidemia - Lipids are abnormal as described. LDL controlled. Continue crestor -- incr to 20. Diet and exercise discussed.    3. Hx endocarditis and subsequent MV rupture with severe MR - S/p MV repair 11/16/15. Clinically stable in this regard with postop echo as above. Plan on serial echo and dental endocarditis ppx going forward.  4. Atrial fibrillation - Paroxysmal and brief postop. CHADS-VASc 2. No recurrence now x yrs postop, including holter monitor. DC eliquis and start ASA 81.  5. OSA - Pulm referral.  6. Obesity - Endo recommended bariatric surg consultation -- I concur and offered referral. He is deferring given his upcoming travel.    [name], MD

## 2017-04-15 ENCOUNTER — Ambulatory Visit
Admit: 2017-04-15 | Discharge: 2017-04-15 | Payer: PRIVATE HEALTH INSURANCE | Attending: Medical Oncology | Primary: Internal Medicine

## 2017-04-15 DIAGNOSIS — C3412 Malignant neoplasm of upper lobe, left bronchus or lung: Secondary | ICD-10-CM

## 2017-04-15 NOTE — Progress Notes (Signed)
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 The patient had CT scan on 03/23/15 as advised.      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  The patient was last seen by me on March 14, 2016 and prior to that on the  October 17, 2015.  The patient had an MRI scan of his chest done in [place] on November 08, 2016.  The patient translated the report for me and there was no evidence of recurrence of his lung cancer.    His major problem relates to obesity.  He has reviewed this with his endocrinologist Dr. Sherril Croon who has suggested bariatric surgery.  The patient met with the bariatric surgeon and is very reluctant to pursue bariatric surgery.  He describes dyspnea on exertion.  The patient was a heavy smoker in the past and I have recommended that he see a pulmonary specialist.  The patient was advised to pursue a program of caloric restriction to achieve weight loss.  He also had questions about taking testosterone injections as recommended by a physician in [place].  I have  advised him to review this with his endocrinologist.    As noted on 03/14/2016:  "He informed me today that she had mitral valve heart surgery at [facility] on 11/16/2015 with Dr. Andree Elk.  His MediPort was removed on 11/15/2016.  He is feeling a lot better since his surgery.  She denies any shortness of breath.  He denies any chest pain.  He denies any hemoptysis.  He is trying to lose weight.  He states that his ulcerative colitis acts up from time to time.  He denies any bright red blood per rectum.  He denies any fevers or chills.  The patient states that he had a CAT scan of the chest and an MRI at [facility] prior to his surgery on November 16, 2015."    As noted on 10/17/2015:  "The patient had CT scan on 03/23/15 as advised. He has not had the mediport removed as advised. He states he was in [place] and was worked up for a GI Bleed, possible ulcer and had 18 polyps removed during a colonoscopy. He states he was told that the polyps were "almost cancer". I have requested a translated copy of the records. He states he had lost weigh as advised but then gained it back in [place] while he was away on Business. He plans to follow up with his Cardiac Surgeons in [facility] and will discuss removal of the mediport. He has some dyspnea on exertion and an occasional cough. He denies any hemoptysis."    As noted on 03/17/15:  "  The patient went for routine checkup and was found to have an abnormality in his left lung on chest x-ray.  This was followed by a CAT scan and further workup leading to surgical resection in [place].  The original pathology report is in  [place].  I have reviewed the translation and the patient had a moderately to well-differentiated squamous cell carcinoma in the left upper lobe excision specimen.  39 lymph nodes were negative for any evidence of metastatic spread: Stage IIB (pT3,pN0,M0).  The patient was not recommended any further adjuvant treatment in [place] but was seen  in Medical Oncology consultation in [place] and was advised to have adjuvant chemotherapy.  He was seen in [place] by Dr. Charlott Holler who also recommended adjuvant chemotherapy with Navelbine and Cisplatin as per NCCN guidelines.  This was started on now 09/29/2010 and according to Dr. Ansel Bong notes he had an elevated pretreatment CA-125 at 75 which eventually decreased to normal.  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  He had an inflamed anal fissure.  The patient travels back and forth between the [place] and [place] on business.  He still has a MediPort which he states he gets flushed once a year.  He has been recommended to have the MediPort removed but informed me that he was waiting for 5 years to be up before he would remove the MediPort.  He denies any fevers or chills.  The patient has surveillance scans every 6 months and is due for a CAT scan of the chest abdomen and pelvis which was last done in [place] in September 2015 and in [place] in January 2015.    The patient denies any allergies.  He smoked at least 1-1/2-2 packs per day for several years and quit smoking 5 years ago.  He has a history of diabetes mellitus and is on insulin.  He has a history of hypertension and takes Atacand.  He developed mitral valve regurgitation following the episode of endocarditis and has symptoms of dyspnea on exertion.  He is overweight since he stopped smoking and is trying to lose weight.  He has seen Dr. Ida Rogue  from Cardiology and has been recommended to have mitral valve surgery which he plans to have this Fall.  He has a history of ulcerative colitis which has not been a problem since he had chemotherapy.  He has a history of sleep apnea and is known to have elevated lipids.  The patient was born in [place] and has lived in the [place] for the  past 25 years.  He states that one half-sister died of some kind of cancer but he is unsure as as to the details.  He has 2 sons who are in good health.    His medications were reviewed."    DX   Encounter Diagnosis   Name Primary?   ??? Primary malignant neoplasm of left upper lobe of lung ([place]) Yes        Past Medical History:   Diagnosis Date   ??? Diabetes ([place])    ??? Endocarditis 2012    dx [facility]; mitral valve; MV repair (Dr. Elio Forget [facility]) 11/16/15   ??? Essential hypertension    ??? Hyperlipidemia    ??? Lung cancer ([place]) 2011    s/p LUL resection and chemo   ??? Primary malignant neoplasm of left upper lobe of lung ([place])    ???  Sleep apnea    ??? Ulcerative colitis ([place])       Past Surgical History:   Procedure Laterality Date   ??? CHEST SURGERY PROCEDURE UNLISTED      left upper lobectomy   ??? HX HEART CATHETERIZATION     ??? HX MITRAL VALVULOPLASTY  11/16/2015    Mitral REPAIR and myomectomy with Dr. Andree Elk, [facility]   ??? HX OTHER SURGICAL  2011    LUL resection      Current Outpatient Prescriptions   Medication Sig   ??? FREESTYLE LITE STRIPS strip USE TO TEST 4 TIMES A DAY AS DIRECTED E11.65   ??? rosuvastatin (CRESTOR) 20 mg tablet Take 1 Tab by mouth nightly.   ??? candesartan (ATACAND) 4 mg tablet Take 0.5 Tabs by mouth daily.   ??? bisoprolol (ZEBETA) 5 mg tablet Take 0.5 Tabs by mouth daily. (Patient taking differently: Take 5 mg by mouth daily.)   ??? metFORMIN (GLUCOPHAGE) 1,000 mg tablet TAKE ONE TAB BY MOUTH TWICE A DAY    **DO NOT RESTART METFORMIN UNTIL Sunday 11/05/15!!!!!!!   ??? insulin degludec (TRESIBA FLEXTOUCH U-100) 100 unit/mL (3 mL) inpn by SubCUTAneous route.   ??? VICTOZA 3-PAK 0.6 mg/0.1 mL (18 mg/3 mL) sub-q pen 1.8 DAILY 90 DAYS SUPPLY DX:250.62   ??? sub-q insulin device, 40 unit devi by Does Not Apply route.     No current facility-administered medications for this visit.       No Known Allergies   Social History   Substance Use Topics   ??? Smoking status: Former Smoker     Packs/day: 2.00      Years: 34.00     Types: Cigarettes     Quit date: 02/20/2011   ??? Smokeless tobacco: Never Used      Comment: e- cig no nicotine   ??? Alcohol use 0.0 oz/week     0 Standard drinks or equivalent per week      Comment: 2shots or beers /week      Family History   Problem Relation Age of Onset   ??? Stroke Mother    ??? Heart Attack Father    ??? High Cholesterol Sister    ??? Hypertension Sister    ??? High Cholesterol Brother    ??? Hypertension Brother         Review of Systems:  A detailed 10 organ review of systems is obtained with pertinent positives as listed in the History of Present Illness and Past Medical History. All others are negative.    Physical Exam:  Visit Vitals   ??? BP 146/74   ??? Pulse 86   ??? Temp 97 ??F (36.1 ??C) (Oral)   ??? Ht '5\' 8"'  (1.727 m)   ??? Wt 262 lb (118.8 kg)   ??? BMI 39.84 kg/m2       General appearance  WD, WN Caucasian male, alert, cooperative, no distress, appears stated age   Head  Normocephalic, without obvious abnormality, atraumatic   Eyes  PERRLA   Nose Nares normal. Septum midline. Mucosa normal. No drainage or sinus tenderness.   Throat Lips, mucosa, and tongue normal. Teeth and gums normal.  Oropharynx clear.   Neck supple, symmetrical, trachea midline, no adenopathy, thyroid: not enlarged, symmetric, no tenderness/mass/nodules,no JVD   Back   No midline or CVA tenderness   Lungs   clear to auscultation bilaterally   Heart  regular rate and rhythm, S1, S2 normal, note IV/VI holosystolic at apex    Abdomen  soft, obese, non-tender, non-distended. Bowel sounds normal. No masses,  No organomegaly   Extremities extremities normal, atraumatic, no cyanosis or edema   Skin Skin color, texture, turgor normal. No rashes or lesions   Lymph nodes Nonpalpable   Neurologic Nonfocal       Labs:   No results found for this or any previous visit (from the past 24 hour(s)).        Impression and Plan:  The patient appears to be in remission with regards to his Stage II B  (pT3,pN0,M0) Squamous cell carcinoma of the Left Upper Lobe s/p Left Upper Lobectomy in July 2011 followed by adjuvant chemotherapy with Navelbine and Cisplatin which was complicated by endocarditis and development of Mitral regurgitation. He is s/p Mitral Valve repair at [facility] on 11/16/2015.    The patient had an MRI of the chest in [place] on November 08, 2016 which was negative for any signs of recurrence of his lung cancer.     I have again recommended that he lose weight and and decrease his oral caloric intake/. He was advised follow-up with his endocrinologist.  I advised the patient to see a pulmonary specialist.        ICD-10-CM ICD-9-CM    1. Primary malignant neoplasm of left upper lobe of lung (H[facility]) C34.12 162.3      Follow-up Disposition:  Return in about 1 year (around 04/15/2018) for Follow-up.  Neysa Hotter, MD  04/15/2017

## 2017-04-15 NOTE — Patient Instructions (Addendum)
Lung Cancer: Care Instructions  Your Care Instructions    Lung cancer occurs when abnormal cells grow out of control in the lung. Lung cancer can start anywhere in the lungs and spread to other parts of the body.  Treatment for lung cancer depends on what type of lung cancer you have and how advanced it is. Treatment may include surgery to remove the cancer. It could also include medicines (chemotherapy) or radiation to destroy cancer cells.  Being treated for cancer can weaken your body, and you may feel very tired. Home treatment and certain medicines can help you feel better.  Finding out that you have cancer is scary. You may feel many emotions and may need some help coping. Seek out family, friends, and counselors for support. You also can do things at home to make yourself feel better while you go through treatment. Call the American Cancer Society (1-800-227-2345) or visit its website at www.cancer.org for more information.  Follow-up care is a key part of your treatment and safety. Be sure to make and go to all appointments, and call your doctor if you are having problems. It's also a good idea to know your test results and keep a list of the medicines you take.  How can you care for yourself at home?  ?? Take your medicines exactly as prescribed. Call your doctor if you think you are having a problem with your medicine. You will get more details on the specific medicines your doctor prescribes.  ?? Follow your doctor's instructions to relieve pain. Use pain medicine when you first feel pain, before it becomes severe. Taking pain medicines regularly is often the best way to keep pain under control.  ?? Eat healthy food. If you do not feel like eating, try to eat food that has protein and extra calories to keep up your strength and prevent weight loss. Drink liquid meal replacements for extra calories and protein. Try to eat your main meal early. Eating smaller portions more often may help as well.   ?? Get some physical activity every day, but do not get too tired. Keep doing the hobbies you enjoy as your energy allows.  ?? Do not smoke. Smoking can make your cancer symptoms worse. If you need help quitting, talk to your doctor about stop-smoking programs and medicines. These can increase your chances of quitting for good.  ?? If you use oxygen, do not smoke, light a cigarette, or use a flame while your oxygen is on. Smoking while using oxygen can lead to fire and even explosion.  ?? If you have nausea, try to eat several small meals a day. When you feel better, eat clear soups and mild foods until all symptoms are gone for 12 to 48 hours. Other good choices include dry toast, crackers, cooked cereal, and gelatin dessert, such as Jell-O.  ?? If you are vomiting or have diarrhea:  ?? Drink plenty of fluids (enough so that your urine is light yellow or clear like water) to prevent dehydration. Choose water and other caffeine-free clear liquids. If you have kidney, heart, or liver disease and have to limit fluids, talk with your doctor before you increase the amount of fluids you drink.  ?? When you are able to eat, try clear soups, mild foods, and liquids until all symptoms are gone for 12 to 48 hours. Other good choices include dry toast, crackers, cooked cereal, and gelatin dessert, such as Jell-O.  ?? Take steps to control your stress and   workload. Learn relaxation techniques.  ?? Share your feelings. Stress and tension affect our emotions. By expressing your feelings to others, you may be able to understand and cope with them.  ?? Consider joining a support group. Talking about a problem with your spouse, a good friend, or other people with similar problems is a good way to reduce tension and stress.  ?? Express yourself with art. Try writing, crafts, dance, or art to relieve stress. Some dance, writing, or art groups may be available just for people who have cancer.   ?? Be kind to your body and mind. Getting enough sleep, eating a healthy diet, and taking time to do things you enjoy can contribute to an overall feeling of balance in your life and help reduce stress.  ?? Get help if you need it. Discuss your concerns with your doctor or counselor.  ?? If you have not already done so, prepare a list of advance directives. Advance directives are instructions to your doctor and family members about what kind of care you want if you become unable to speak or express yourself.  When should you call for help?  Call 911 anytime you think you may need emergency care. For example, call if:  ? ?? You passed out (lost consciousness).   ? ?? You have severe trouble breathing.   ? ?? You cough up a lot of blood.   ?Call your doctor now or seek immediate medical care if:  ? ?? You have a fever.   ? ?? You are short of breath.   ? ?? You have new or worse pain.   ? ?? You have a new or worse cough.   ? ?? You think you have an infection.   ?Watch closely for changes in your health, and be sure to contact your doctor if:  ? ?? You do not get better as expected.   Where can you learn more?  Go to http://www.healthwise.net/GoodHelpConnections.  Enter D847 in the search box to learn more about "Lung Cancer: Care Instructions."  Current as of: Apr 26, 2016  Content Version: 11.4  ?? 2006-2017 [employer]. Care instructions adapted under license by [employer] (which disclaims liability or warranty for this information). If you have questions about a medical condition or this instruction, always ask your healthcare professional. Healthwise, Incorporated disclaims any warranty or liability for your use of this information.

## 2017-04-18 ENCOUNTER — Encounter: Payer: PRIVATE HEALTH INSURANCE | Primary: Internal Medicine

## 2017-04-21 NOTE — Telephone Encounter (Signed)
Patient advised on Echo from 04/18/2017 stable. No significant change is seen when compared to previous study. Results to be reviewed in detail during follow up appointment.

## 2018-01-23 ENCOUNTER — Telehealth

## 2018-01-23 MED ORDER — CANDESARTAN 4 MG TAB
4 mg | ORAL_TABLET | Freq: Every day | ORAL | 3 refills | Status: DC
Start: 2018-01-23 — End: 2018-11-24

## 2018-01-23 MED ORDER — ROSUVASTATIN 20 MG TAB
20 mg | ORAL_TABLET | Freq: Every evening | ORAL | 3 refills | Status: DC
Start: 2018-01-23 — End: 2018-11-24

## 2018-01-23 NOTE — Telephone Encounter (Signed)
CANDESARTAN 4 MG   CRESTOR 20 MG   90 DAYS     [facility]

## 2018-03-04 ENCOUNTER — Ambulatory Visit
Admit: 2018-03-04 | Discharge: 2018-03-04 | Payer: PRIVATE HEALTH INSURANCE | Attending: Cardiovascular Disease | Primary: Internal Medicine

## 2018-03-04 DIAGNOSIS — I059 Rheumatic mitral valve disease, unspecified: Secondary | ICD-10-CM

## 2018-03-04 NOTE — Progress Notes (Signed)
03/04/2018     Patient:  Bobby Guzman   DOB:  10-04-1961       CHIEF COMPLAINT:   Chief Complaint   Patient presents with   ??? Irregular Heart Beat     follow up    ??? Shortness of Breath         HISTORY OF PRESENT ILLNESS: Cordaro Charon presents for cardiology follow up. DOE unchanged. +Orthopnea. Notes "bubbling" in his chest when lying flat; feels like sputum in chest but does not produce phlegm when he coughs. Denies CP, diz, palps.  ??  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 Also usually spends part of winter in [place].        PAST MEDICAL HISTORY:  Past Medical History:   Diagnosis Date   ??? Diabetes ([place])    ??? Endocarditis 2012    dx [facility]; mitral valve; MV repair (Dr. Elio Forget [facility]) 11/16/15   ??? Essential hypertension    ??? Hyperlipidemia    ??? Lung cancer ([place]) 2011    s/p LUL resection and chemo   ??? Primary malignant neoplasm of left upper lobe of lung ([place])    ??? Sleep apnea    ??? Ulcerative colitis ([place])         PAST SURGICAL HISTORY:  Past Surgical History:   Procedure Laterality Date   ??? CHEST SURGERY PROCEDURE UNLISTED      left upper lobectomy   ??? HX HEART CATHETERIZATION     ??? HX MITRAL VALVULOPLASTY  11/16/2015    Mitral REPAIR and myomectomy with Dr. Andree Elk, [facility]   ??? HX OTHER SURGICAL  2011    LUL resection       MEDICATIONS:  Current Outpatient Medications   Medication Sig Dispense Refill   ??? candesartan (ATACAND) 4 mg tablet Take 0.5 Tabs by mouth daily. 45 Tab 3   ??? rosuvastatin (CRESTOR) 20 mg tablet Take 1 Tab by mouth nightly. 90 Tab 3   ??? FREESTYLE LITE STRIPS strip USE TO TEST 4 TIMES A DAY AS DIRECTED E11.65  3   ??? metFORMIN (GLUCOPHAGE) 1,000 mg tablet TAKE ONE TAB BY MOUTH TWICE A DAY    **DO NOT RESTART METFORMIN UNTIL Sunday 11/05/15!!!!!!! 180 Tab 1   ??? insulin degludec (TRESIBA FLEXTOUCH U-100) 100 unit/mL (3 mL) inpn by SubCUTAneous route.     ??? VICTOZA 3-PAK 0.6 mg/0.1 mL (18 mg/3 mL) sub-q pen 1.8 DAILY 90 DAYS SUPPLY DX:250.62  1    ??? sub-q insulin device, 40 unit devi by Does Not Apply route.          ALLERGIES:   No Known Allergies    SOCIAL HISTORY:  Social History     Socioeconomic History   ??? Marital status: MARRIED     Spouse name: Not on file   ??? Number of children: Not on file   ??? Years of education: Not on file   ??? Highest education level: Not on file   Tobacco Use   ??? Smoking status: Former Smoker     Packs/day: 2.00     Years: 34.00     Pack years: 68.00     Types: Cigarettes     Last attempt to quit: 02/20/2011     Years since quitting: 7.0   ??? Smokeless tobacco: Never Used   ??? Tobacco comment: e- cig no nicotine   Substance and Sexual Activity   ??? Alcohol use: Yes  Alcohol/week: 0.0 oz     Comment: 2shots or beers /week   ??? Drug use: No         FAMILY HISTORY:  Family History   Problem Relation Age of Onset   ??? Stroke Mother    ??? Heart Attack Father    ??? High Cholesterol Sister    ??? Hypertension Sister    ??? High Cholesterol Brother    ??? Hypertension Brother        REVIEW OF SYSTEMS:  Neuro: No headache, focal weakness/numbness, parasthesias  Eyes: No visual disturbance  ENMT: No soar throat, nasal congestion, hearing loss, tinnitus  CV: No chest pain, palpitations, dizziness, syncope, claudication  Resp: dyspnea  GI: abdominal pain  GU: No hematuria, dysuria  Skin: No rash  Heme: No bleeding, bruising  Constitutional: No fatigue, weakness, fever, weight change  Musculoskeletal: No myalgias, arthralgias  Other:    PHYSICAL EXAM:  Blood pressure 138/74, pulse 82, height 5\' 8"  (1.727 m), weight 265 lb (120.2 kg), SpO2 97 %.   Body mass index is 40.29 kg/m??.   General: Well developed, well nourished, no acute distress, appears stated age  Eyes: Anicteric, no hemorrhage, normal appearing conjunctivae and lids  ENMT: Normal externally  Neck: Supple, no JVD, no carotid bruit  CV: RRR, normal S1S2, no M/G/R  Lungs: CTA bilaterally, normal respiratory excursion  Abd: Soft, NT, obese/dist  Extremities: No pedal edema  Skin: Warm and dry   Musculoskeletal: Normal muscle tone, normal gait  Neuro: Nonfocal exam, no rigidity/tremor  Psych: Alert and oriented, appropriate affect      LABS AND TESTING:  ?? EKG: NSR, LBBB  ?? ABI/PVR 03/06/15 - nl  ?? TEE 03/07/15 - nl LV/RV size/fxn, anterior MV leaflet with rupture, severe MR  ?? Cath 11/02/15 - no significant CAD, severe MR, LVEDP 21  ?? Echo 12/13/15 - moderate LVH, LVEF 50-55, severe LAE, nl RV, MV annuloplasty ring with trace MR  ?? 24 hr holter 12/13/15 - NSR 60-98 (avg 75), IVCD, occasional PVC, NSVT vs SVT (more likely) x 1 for 13 beats, no AF  ?? Echo 04/2017 - moderate LVH, LVEF 50-55, severe LAE, nl RV, MV annuloplasty ring with trace MR, trace AI          ASSESSMENT AND PLAN:    1. HTN - Blood pressure is mildly elevated; prior visit well controlled. Continue current antihypertensive regimen.  2. Dyslipidemia - Recent lipids unknown. Continue current medications and follow up labs.   3. Hx endocarditis and subsequent MV rupture with severe MR - S/p MV repair 11/16/15. Clinically stable in this regard. Plan on serial echo and dental endocarditis ppx going forward.  4. Atrial fibrillation - Paroxysmal and brief postop. CHADS-VASc 2. No recurrence now x yrs postop, including holter monitor -- continue ASA 81.  5. OSA - Pulm referral.  6. SOB/orthopnea, abd discomfort - Check echo and labs. Pulm and endo eval.        [name], MD

## 2018-03-05 NOTE — Telephone Encounter (Signed)
Son called, forgot to mention that his father has been having nose bleeds on & off for about 2-3 months.     Does this have any correlation with him starting the following medication:     Aspirin delayed release 81mg  tablet    Please advise       Thank you

## 2018-03-07 LAB — CBC WITH AUTOMATED DIFF
ABS. BASOPHILS: 37 Cells/mcL (ref 0–200)
ABS. EOSINOPHILS: 177 Cells/mcL (ref 15–500)
ABS. LYMPHOCYTES: 2502 Cells/mcL (ref 850–3900)
ABS. MONOCYTES: 735 Cells/mcL (ref 200–950)
ABS. NEUTROPHILS: 5850 Cells/mcL (ref 1500–7800)
BASOPHILS: 0.4 % (ref 0–2)
EOSINOPHILS: 1.9 % (ref 0–8)
HCT: 45.4 % (ref 38.5–50.0)
HGB: 14.8 g/dL (ref 13.2–17.1)
LYMPHOCYTES: 26.9 % (ref 15–49)
MCH: 29.1 pg (ref 27.0–33.0)
MCHC: 32.6 g/dL (ref 32.0–36.0)
MCV: 89.1 fL (ref 80.0–100.0)
MEAN PLATELET VOLUME: 8.5 fL (ref 7.5–12.5)
MONOCYTES: 7.9 % (ref 0–13)
Neutrophils: 62.9 % (ref 38–80)
PLATELET: 226 10*3/uL (ref 140–400)
RBC: 5.1 10*6/uL (ref 4.20–5.80)
RDW: 14.8 % (ref 11.0–15.0)
WBC: 9.3 10*3/uL (ref 3.8–10.8)

## 2018-03-07 LAB — METABOLIC PANEL, COMPREHENSIVE
ALB/GLOBRATIO: 1.5 (calc) (ref 1.0–2.5)
ALT (SGPT): 18 U/L (ref 9–46)
AST (SGOT): 21 U/L (ref 10–35)
Albumin: 4.1 g/dL (ref 3.6–5.1)
Alk. phosphatase: 83 U/L (ref 40–115)
BUN: 18 mg/dL (ref 7–25)
Bilirubin, total: 0.5 mg/dL (ref 0.2–1.2)
CO2: 27 mmol/L (ref 20–32)
Calcium: 9.2 mg/dL (ref 8.6–10.3)
Chloride: 99 mmol/L (ref 98–110)
Creatinine: 0.88 mg/dL (ref 0.70–1.33)
EGFR NON AFR AMERICAN: 95 mL/min/{1.73_m2} (ref >=60)
GFR est AA: 110 mL/min/{1.73_m2} (ref >=60)
Globulin: 2.8 g/dL (calc) (ref 1.9–3.7)
Glucose: 134 mg/dL — ABNORMAL HIGH (ref 65–99)
Potassium: 4.4 mmol/L (ref 3.5–5.3)
Protein, total: 6.9 g/dL (ref 6.1–8.1)
Sodium: 136 mmol/L (ref 135–146)

## 2018-03-07 LAB — LIPID PANEL
Cholesterol, total: 182 mg/dL (ref <200)
Cholesterol/HDL ratio: 6.1 calc — ABNORMAL HIGH (ref <5.0)
HDL Cholesterol: 30 mg/dL — ABNORMAL LOW (ref >40)
LDL CHOL, CALCULATED: 125 mg/dL — ABNORMAL HIGH (ref <100)
Non-HDL Cholesterol: 152 mg/dL (calc) — ABNORMAL HIGH (ref <130)
Triglyceride: 154 mg/dL — ABNORMAL HIGH (ref <150)

## 2018-03-07 LAB — HEMOGLOBIN A1C W/O EAG: Hemoglobin A1c: 7.4 % of total Hgb — ABNORMAL HIGH (ref <5.7)

## 2018-03-07 LAB — AMYLASE: Amylase: 34 U/L (ref 21–101)

## 2018-03-07 LAB — LIPASE: Lipase: 37 U/L (ref 7–60)

## 2018-03-07 LAB — TSH 3RD GENERATION: TSH: 1.15 mIU/L (ref 0.40–4.50)

## 2018-03-10 ENCOUNTER — Ambulatory Visit
Admit: 2018-03-10 | Discharge: 2018-03-10 | Payer: PRIVATE HEALTH INSURANCE | Attending: Medical Oncology | Primary: Internal Medicine

## 2018-03-10 DIAGNOSIS — C3412 Malignant neoplasm of upper lobe, left bronchus or lung: Secondary | ICD-10-CM

## 2018-03-10 NOTE — Patient Instructions (Addendum)
Lung Cancer: Care Instructions  Your Care Instructions    Lung cancer occurs when abnormal cells grow out of control in the lung. Lung cancer can start anywhere in the lungs and spread to other parts of the body.  Treatment for lung cancer depends on what type of lung cancer you have and how advanced it is. Treatment may include surgery to remove the cancer. It could also include medicines (chemotherapy) or radiation to destroy cancer cells.  Being treated for cancer can weaken your body, and you may feel very tired. Home treatment and certain medicines can help you feel better.  Finding out that you have cancer is scary. You may feel many emotions and may need some help coping. Seek out family, friends, and counselors for support. You also can do things at home to make yourself feel better while you go through treatment. Call the American Cancer Society (1-800-227-2345) or visit its website at www.cancer.org for more information.  Follow-up care is a key part of your treatment and safety. Be sure to make and go to all appointments, and call your doctor if you are having problems. It's also a good idea to know your test results and keep a list of the medicines you take.  How can you care for yourself at home?  ?? Take your medicines exactly as prescribed. Call your doctor if you think you are having a problem with your medicine. You will get more details on the specific medicines your doctor prescribes.  ?? Follow your doctor's instructions to relieve pain. Use pain medicine when you first feel pain, before it becomes severe. Taking pain medicines regularly is often the best way to keep pain under control.  ?? Eat healthy food. If you do not feel like eating, try to eat food that has protein and extra calories to keep up your strength and prevent weight loss. Drink liquid meal replacements for extra calories and protein. Try to eat your main meal early. Eating smaller portions more often may help as well.   ?? Get some physical activity every day, but do not get too tired. Keep doing the hobbies you enjoy as your energy allows.  ?? Do not smoke. Smoking can make your cancer symptoms worse. If you need help quitting, talk to your doctor about stop-smoking programs and medicines. These can increase your chances of quitting for good.  ?? If you use oxygen, do not smoke, light a cigarette, or use a flame while your oxygen is on. Smoking while using oxygen can lead to fire and even explosion.  ?? If you have nausea, try to eat several small meals a day. When you feel better, eat clear soups and mild foods until all symptoms are gone for 12 to 48 hours. Other good choices include dry toast, crackers, cooked cereal, and gelatin dessert, such as Jell-O.  ?? If you are vomiting or have diarrhea:  ? Drink plenty of fluids (enough so that your urine is light yellow or clear like water) to prevent dehydration. Choose water and other caffeine-free clear liquids. If you have kidney, heart, or liver disease and have to limit fluids, talk with your doctor before you increase the amount of fluids you drink.  ? When you are able to eat, try clear soups, mild foods, and liquids until all symptoms are gone for 12 to 48 hours. Other good choices include dry toast, crackers, cooked cereal, and gelatin dessert, such as Jell-O.  ?? Take steps to control your stress and   workload. Learn relaxation techniques.  ? Share your feelings. Stress and tension affect our emotions. By expressing your feelings to others, you may be able to understand and cope with them.  ? Consider joining a support group. Talking about a problem with your spouse, a good friend, or other people with similar problems is a good way to reduce tension and stress.  ? Express yourself with art. Try writing, crafts, dance, or art to relieve stress. Some dance, writing, or art groups may be available just for people who have cancer.   ? Be kind to your body and mind. Getting enough sleep, eating a healthy diet, and taking time to do things you enjoy can contribute to an overall feeling of balance in your life and help reduce stress.  ? Get help if you need it. Discuss your concerns with your doctor or counselor.  ?? If you have not already done so, prepare a list of advance directives. Advance directives are instructions to your doctor and family members about what kind of care you want if you become unable to speak or express yourself.  When should you call for help?  Call 911 anytime you think you may need emergency care. For example, call if:  ?? ?? You passed out (lost consciousness).   ?? ?? You have severe trouble breathing.   ?? ?? You cough up a lot of blood.   ??Call your doctor now or seek immediate medical care if:  ?? ?? You have a fever.   ?? ?? You are short of breath.   ?? ?? You have new or worse pain.   ?? ?? You have a new or worse cough.   ?? ?? You think you have an infection.   ??Watch closely for changes in your health, and be sure to contact your doctor if:  ?? ?? You do not get better as expected.   Where can you learn more?  Go to [employer].  Enter [phone] in the search box to learn more about "Lung Cancer: Care Instructions."  Current as of: March 11, 2017  Content Version: 11.9  ?? 2006-2018 [employer], [employer]. Care instructions adapted under license by [employer] (which disclaims liability or warranty for this information). If you have questions about a medical condition or this instruction, always ask your healthcare professional. [employer] any warranty or liability for your use of this information.

## 2018-03-10 NOTE — Progress Notes (Signed)
Pt saw Dr. Lawanna Kobus in follow up on 03/04/18. Going for echo on Friday. Pt reports increased SOB on exertion. Acid reflux is bothering him lately. Pt reports MRI Chest on in [place] on 11/08/16. He has been following up with Dr. Sherril Croon regularly. He is seeing Dr. Fredrik Rigger at [facility] for sleep apnea follow up next week.

## 2018-03-10 NOTE — Progress Notes (Signed)
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 The patient had CT scan on 03/23/15 as advised.      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  The patient was last seen by me on 04/15/2017 and prior to that on March 14, 2016 and October 17, 2015.  The patient had informed me that during his previous visit that he had an MRI scan of his chest done in [place] on November 08, 2016.  The patient had translated the report for me and there was no evidence of recurrence of his lung cancer.    The patient has been in [place] and underwent endoscopy for fullness and discomfort of his upper abdomen with symptoms of gastroesophageal reflux.  From what I can gather he was told that he may have a hiatal hernia.  He was prescribed pantoprazole. He was seen by Dr. Ida Rogue last week due to increasing SOB and cough +phlegm. He denies hemoptysis.   He has not lost any weight. He reports he does not exercise and has not cut down caloric intakes.  He understands that he needs to lose weight.     The patient denies any symptoms of cough or hemoptysis.  After discussion it was decided that he will have a CAT scan of the chest abdomen and pelvis for surveillance of his lung cancer.  I have also recommended that he make an appointment to see the gastroenterologist.  He plans to travel to [place] in April and will return to the [place] in December 2019.      As noted 04/15/2017:  "His major problem relates to obesity.  He has reviewed this with his endocrinologist Dr. Sherril Croon who has suggested bariatric surgery.  The patient met with the bariatric surgeon and is very reluctant to pursue bariatric surgery.  He describes dyspnea on exertion.  The patient was a heavy smoker in the past and I have recommended that he see a pulmonary specialist.  The patient was advised to pursue a program of caloric restriction to achieve weight loss.  He also had questions about taking testosterone injections as recommended by a physician in [place].  I have advised him to review this with his endocrinologist."    As noted on 03/14/2016:  "He informed me today that she had mitral valve heart surgery at [facility] on 11/16/2015 with Dr. Andree Elk.  His MediPort was removed on 11/15/2016.  He is feeling a lot better since his surgery.  She denies any shortness of breath.  He denies any chest pain.  He denies any hemoptysis.  He is trying to lose weight.  He states that his ulcerative colitis acts up from time to time.  He denies any bright red blood per rectum.  He denies any fevers or chills.  The patient states that he had a CAT scan of the chest and an MRI at [facility] prior to his surgery on November 16, 2015."    As noted on 10/17/2015:  "The patient had CT scan on 03/23/15 as advised. He has not had the mediport removed as advised. He states he was in [place] and was worked up for a GI Bleed, possible ulcer and had 18 polyps removed during a colonoscopy. He  states he was told that the polyps were "almost cancer". I have requested a translated copy of the records. He states he had lost weigh as advised but then gained it back in [place] while he was away on Business. He plans to follow up with his Cardiac Surgeons in [facility] and will discuss removal of the mediport. He has some dyspnea on exertion and an occasional cough. He denies any hemoptysis."    As noted on 03/17/15:  "The patient went for routine checkup and was found to have an abnormality in his left lung on chest x-ray.  This was followed by a CAT scan and further workup leading to surgical resection in [place].  The original pathology report is in  [place].  I have reviewed the translation and the patient had a moderately to well-differentiated squamous cell carcinoma in the left upper lobe excision specimen.  39 lymph nodes were negative for any evidence of metastatic spread: Stage IIB (pT3,pN0,M0).  The patient was not recommended any further adjuvant treatment in [place] but was seen in Medical Oncology consultation in [place] and was advised to have adjuvant chemotherapy.  He was seen in [place] by Dr. Charlott Holler who also recommended adjuvant chemotherapy with Navelbine and Cisplatin as per NCCN guidelines.  This was started on now 09/29/2010 and according to Dr. Ansel Bong notes he had an elevated pretreatment CA-125 at 41 which eventually decreased to normal.  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  He had an inflamed anal fissure.  The patient travels back and forth between the [place] and [place] on business.  He still has a MediPort which he states he gets flushed once a year.  He has been recommended to have the MediPort removed but informed me that he was waiting for 5 years to be up before he would remove the MediPort.  He denies any fevers or chills.  The patient has surveillance  scans every 6 months and is due for a CAT scan of the chest abdomen and pelvis which was last done in [place] in September 2015 and in [place] in January 2015.    The patient denies any allergies.  He smoked at least 1-1/2-2 packs per day for several years and quit smoking 5 years ago.  He has a history of diabetes mellitus and is on insulin.  He has a history of hypertension and takes Atacand.  He developed mitral valve regurgitation following the episode of endocarditis and has symptoms of dyspnea on exertion.  He is overweight since he stopped smoking and is trying to lose weight.  He has seen Dr. Ida Rogue  from Cardiology and has been recommended to have mitral valve surgery which he plans to have this Fall.  He has a history of ulcerative colitis which has not been a problem since he had chemotherapy.  He has a history of  sleep apnea and is known to have elevated lipids.  The patient was born in [place] and has lived in the [place] for the past 25 years.  He states that one half-sister died of some kind of cancer but he is unsure as as to the details.  He has 2 sons who are in good health.    His medications were reviewed."    DX   Encounter Diagnosis   Name Primary?   ??? Primary malignant neoplasm of left upper lobe of lung ([place]) Yes        Past Medical History:   Diagnosis Date   ??? Diabetes ([place])    ??? Endocarditis 2012    dx [facility]; mitral valve; MV repair (Dr. Elio Forget [facility]) 11/16/15   ??? Essential hypertension    ??? Hyperlipidemia    ??? Lung cancer ([place]) 2011    s/p LUL resection and chemo   ??? Primary malignant neoplasm of left upper lobe of lung ([place])    ??? Sleep apnea    ??? Ulcerative colitis ([place])       Past Surgical History:   Procedure Laterality Date   ??? CHEST SURGERY PROCEDURE UNLISTED      left upper lobectomy   ??? HX HEART CATHETERIZATION     ??? HX MITRAL VALVULOPLASTY  11/16/2015    Mitral REPAIR and myomectomy with Dr. Andree Elk, [facility]   ??? HX OTHER SURGICAL  2011    LUL resection       Current Outpatient Medications   Medication Sig   ??? pantoprazole (PROTONIX) 20 mg tablet Take 20 mg by mouth daily.   ??? candesartan (ATACAND) 4 mg tablet Take 0.5 Tabs by mouth daily.   ??? rosuvastatin (CRESTOR) 20 mg tablet Take 1 Tab by mouth nightly.   ??? FREESTYLE LITE STRIPS strip USE TO TEST 4 TIMES A DAY AS DIRECTED E11.65   ??? metFORMIN (GLUCOPHAGE) 1,000 mg tablet TAKE ONE TAB BY MOUTH TWICE A DAY    **DO NOT RESTART METFORMIN UNTIL Sunday 11/05/15!!!!!!!   ??? insulin degludec (TRESIBA FLEXTOUCH U-100) 100 unit/mL (3 mL) inpn by SubCUTAneous route.   ??? VICTOZA 3-PAK 0.6 mg/0.1 mL (18 mg/3 mL) sub-q pen 1.8 DAILY 90 DAYS SUPPLY DX:250.62   ??? sub-q insulin device, 40 unit devi by Does Not Apply route.     No current facility-administered medications for this visit.       No Known Allergies   Social History     Tobacco Use   ??? Smoking status: Former Smoker     Packs/day: 2.00     Years: 34.00     Pack years: 68.00     Types: Cigarettes     Last attempt to quit: 02/20/2011     Years since quitting: 7.0   ??? Smokeless tobacco: Never Used   ??? Tobacco comment: e- cig no nicotine   Substance Use Topics   ??? Alcohol use: Yes     Alcohol/week: 0.0 oz     Comment: 2shots or beers /week      Family History   Problem Relation Age of Onset   ??? Stroke Mother    ??? Heart Attack Father    ??? High Cholesterol Sister    ??? Hypertension Sister    ??? High Cholesterol Brother    ??? Hypertension Brother         Review of Systems:  A detailed 10 organ review of systems is obtained with pertinent positives as  listed in the History of Present Illness and Past Medical History. All others are negative.    Physical Exam:  Visit Vitals  BP 146/86   Pulse 92   Temp 97 ??F (36.1 ??C) (Oral)   Ht _0  (1.727 m)   Wt 265 lb (120.2 kg)   BMI 40.29 kg/m??       General appearance  WD, WN, be Caucasian male, alert, cooperative, no distress, appears stated age   Head  Normocephalic, without obvious abnormality, atraumatic   Eyes  PERRLA    Nose Nares normal. Septum midline. Mucosa normal. No drainage or sinus tenderness.   Throat Lips, mucosa, and tongue normal. Teeth and gums normal.  Oropharynx clear.   Neck supple, symmetrical, trachea midline, no adenopathy, thyroid: not enlarged, symmetric, no tenderness/mass/nodules,no JVD   Back   No midline or CVA tenderness   Lungs   clear to auscultation bilaterally   Heart  regular rate and rhythm, S1, S2 normal, note IV/VI holosystolic at apex    Abdomen   soft, obese, non-tender, non-distended. Bowel sounds normal. No masses,  No organomegaly   Extremities extremities normal, atraumatic, no cyanosis or edema   Skin Skin color, texture, turgor normal. No rashes or lesions   Lymph nodes Nonpalpable   Neurologic Nonfocal       Labs:   No results found for this or any previous visit (from the past 24 hour(s)).        Impression and Plan:  I recommended that the patient have a CAT scan of the chest abdomen and pelvis.    He was advised to make an appointment to see the gastroenterologist and was provided with information for Dr. Cori Razor.He was once again advised to lose weight and exercise.    He plans to return back to [place] in 2 weeks and will not be back in the [place] Until December.   The patient appears to be in remission with regards to his Stage II B (pT3,pN0,M0) Squamous cell carcinoma of the Left Upper Lobe s/p Left Upper Lobectomy in July 2011 followed by adjuvant chemotherapy with Navelbine and Cisplatin which was complicated by endocarditis and development of Mitral regurgitation. He is s/p Mitral Valve repair at [facility] on 11/16/2015.    The patient had an MRI of the chest in [place] on November 08, 2016 which was negative for any signs of recurrence of his lung cancer.     I have again recommended that he lose weight and and decrease his oral caloric intake/. He was advised follow-up with his endocrinologist.  I advised the patient to see a pulmonary specialist.         ICD-10-CM ICD-9-CM    1. Primary malignant neoplasm of left upper lobe of lung (HCC) C34.12 162.3 CT CHEST ABD PELV W WO CONT     Follow-up and Dispositions    ?? Return in about 6 months (around 09/10/2018).       Neysa Hotter, MD  03/10/2018

## 2018-03-11 NOTE — Telephone Encounter (Signed)
s/w son Iona Beard, advised to see Dr Clovis Riley in [place].

## 2018-03-13 ENCOUNTER — Encounter: Payer: PRIVATE HEALTH INSURANCE | Primary: Internal Medicine

## 2018-03-16 ENCOUNTER — Encounter

## 2018-03-17 ENCOUNTER — Ambulatory Visit
Admit: 2018-03-17 | Discharge: 2018-03-17 | Payer: PRIVATE HEALTH INSURANCE | Attending: Internal Medicine | Primary: Internal Medicine

## 2018-03-17 ENCOUNTER — Inpatient Hospital Stay: Admit: 2018-03-17 | Payer: PRIVATE HEALTH INSURANCE | Attending: Medical Oncology | Primary: Internal Medicine

## 2018-03-17 ENCOUNTER — Ambulatory Visit

## 2018-03-17 DIAGNOSIS — G4733 Obstructive sleep apnea (adult) (pediatric): Secondary | ICD-10-CM

## 2018-03-17 DIAGNOSIS — C3412 Malignant neoplasm of upper lobe, left bronchus or lung: Secondary | ICD-10-CM

## 2018-03-17 MED ORDER — IOPAMIDOL 61 % IV SOLN
300 mg iodine /mL (61 %) | Freq: Once | INTRAVENOUS | Status: AC
Start: 2018-03-17 — End: 2018-03-17
  Administered 2018-03-17: 18:00:00 via INTRAVENOUS

## 2018-03-17 MED ORDER — BARIUM SULFATE 2 % ORAL SUSP
2.1 % (w/v), 2.0 % (w/w) | Freq: Once | ORAL | Status: AC
Start: 2018-03-17 — End: 2018-03-17
  Administered 2018-03-17: 18:00:00 via ORAL

## 2018-03-17 MED FILL — ISOVUE-300  61 % INTRAVENOUS SOLUTION: 300 mg iodine /mL (61 %) | INTRAVENOUS | Qty: 150

## 2018-03-17 MED FILL — BARIUM SULFATE 2 % ORAL SUSP: 2.1 % (w/v), 2.0 % (w/w) | ORAL | Qty: 900

## 2018-03-17 NOTE — Progress Notes (Signed)
[facility]    Pulmonary, Critical Care, and Sleep Medicine  Name: Bobby Guzman MRN: [id number]   DOB: 09-13-1961 Hospital:    Date: 03/17/2018        Sleep  Initial  Office  Consult                                              Consult requesting physician: [name], MD    Reason for Consult:   Chief Complaint   Patient presents with   ??? CPAP     as per pt      IMPRESSION:   OSA diagnosed 7 years ago in [place] placed on AutoPap 4-20 cm  No prior sleep study available  Hypersomnia N.O.S.  Lung cancer post lobectomy 8 years ago in [place]  Chemotherapy and [facility] 7 years ago  Obesity with BMI 39.84      RECOMMENDATIONS:   ?? Patient with REMstar AutoPap since 7 years ago  ?? Have not had component replacement other than filter change 7 years  ?? Patient requests travel Pap-explain travel Pap is not covered by insurance  ?? Patient requests new Pap and components but does not have prior sleep study  ?? Explained to patient in order to be covered by insurance a requalify sleep study will be needed  ?? Compliance expect patient in follow-up visit within 90 days of issue new Pap also reviewed  ?? Patient scheduled to travel back to [place] next week  ?? Will not be back until December 2019  ?? Requests prescription for self-pay we are Internet for AutoPap with nasal pillows  ?? Wt loss  ?? Driving risks and EtOH sedative effect on OSA reviewed with patient    Follow-up in December if we qualifying test to be performed then order new AutoPap and follow-up compliance 30-90 days       HPI:  56 y.o. male seen on 03/17/2018 with chief complaint of OSA on AutoPap.  Patient previously tested in [place] with moderate OSA per self report.  No results available.  Patient given AutoPap 4-20 cm 7 years ago but did not have follow-up visit or component replacement for past 7 years.  Patient travel in and out of and request new Pap for travel.   Explained to patient air mini and Respironics go or Z1 are not covered by insurance.  In order to have new components or new Pap requalify sleep study as needed in order to submit to contracted DME and insurance company.  Patient scheduled to return to [place] next week and will not return until December 2019.  Requests a prescription to purchase Pap over Internet.  Micro CPAP versus nasal purifying device discussed and review-not for treatment of sleep apnea and no results or FDA approval.    Past Medical History:   Diagnosis Date   ??? Diabetes ([place])    ??? Endocarditis 2012    dx [facility]; mitral valve; MV repair (Dr. Elio Forget [facility]) 11/16/15   ??? Essential hypertension    ??? Hyperlipidemia    ??? Lung cancer ([place]) 2011    s/p LUL resection and chemo   ??? Primary malignant neoplasm of left upper lobe of lung ([place])    ??? Sleep apnea    ??? Ulcerative colitis [facility])      Past Surgical History:  Procedure Laterality Date   ??? CHEST SURGERY PROCEDURE UNLISTED      left upper lobectomy   ??? HX HEART CATHETERIZATION     ??? HX MITRAL VALVULOPLASTY  11/16/2015    Mitral REPAIR and myomectomy with Dr. Andree Elk, [facility]   ??? HX OTHER SURGICAL  2011    LUL resection     MEDICATIONS:   Current Outpatient Medications   Medication Sig Dispense Refill   ??? candesartan (ATACAND) 4 mg tablet Take 0.5 Tabs by mouth daily. 45 Tab 3   ??? rosuvastatin (CRESTOR) 20 mg tablet Take 1 Tab by mouth nightly. 90 Tab 3   ??? FREESTYLE LITE STRIPS strip USE TO TEST 4 TIMES A DAY AS DIRECTED E11.65  3   ??? metFORMIN (GLUCOPHAGE) 1,000 mg tablet TAKE ONE TAB BY MOUTH TWICE A DAY    **DO NOT RESTART METFORMIN UNTIL Sunday 11/05/15!!!!!!! 180 Tab 1   ??? insulin degludec (TRESIBA FLEXTOUCH U-100) 100 unit/mL (3 mL) inpn by SubCUTAneous route.     ??? VICTOZA 3-PAK 0.6 mg/0.1 mL (18 mg/3 mL) sub-q pen 1.8 DAILY 90 DAYS SUPPLY DX:250.62  1   ??? sub-q insulin device, 40 unit devi by Does Not Apply route.      ??? pantoprazole (PROTONIX) 20 mg tablet Take 20 mg by mouth daily.       ALLERGIES:   No Known Allergies    SOCIAL:   Social History     Socioeconomic History   ??? Marital status: MARRIED     Spouse name: Not on file   ??? Number of children: Not on file   ??? Years of education: Not on file   ??? Highest education level: Not on file   Occupational History   ??? Not on file   Social Needs   ??? Financial resource strain: Not on file   ??? Food insecurity:     Worry: Not on file     Inability: Not on file   ??? Transportation needs:     Medical: Not on file     Non-medical: Not on file   Tobacco Use   ??? Smoking status: Former Smoker     Packs/day: 2.00     Years: 34.00     Pack years: 68.00     Types: Cigarettes     Last attempt to quit: 02/20/2011     Years since quitting: 7.0   ??? Smokeless tobacco: Never Used   ??? Tobacco comment: e- cig no nicotine   Substance and Sexual Activity   ??? Alcohol use: Yes     Alcohol/week: 0.0 oz     Comment: 2shots or beers /week   ??? Drug use: No   ??? Sexual activity: Not on file   Lifestyle   ??? Physical activity:     Days per week: Not on file     Minutes per session: Not on file   ??? Stress: Not on file   Relationships   ??? Social connections:     Talks on phone: Not on file     Gets together: Not on file     Attends religious service: Not on file     Active member of club or organization: Not on file     Attends meetings of clubs or organizations: Not on file     Relationship status: Not on file   ??? Intimate partner violence:     Fear of current or ex partner: Not on file  Emotionally abused: Not on file     Physically abused: Not on file     Forced sexual activity: Not on file   Other Topics Concern   ??? Not on file   Social History Narrative   ??? Not on file     FAMILY HISTORY:   Family History   Problem Relation Age of Onset   ??? Stroke Mother    ??? Heart Attack Father    ??? High Cholesterol Sister    ??? Hypertension Sister    ??? High Cholesterol Brother    ??? Hypertension Brother       Self estimate total sleep time 5 hrs.    Patient is currently on benzodiazepam therapy: NO  Patient's bed partner has noted him to have snoring: NO, while on AutoPap  Patient's bed partner has noted him to have apneic episode: NO, while on AutoPap    Patient reports:  gagging gasping awakening:  NO, while on AutoPap  tired, fatigued or sleepy during the day:   NO, while on AutoPap    Patient wakes up unrefreashed in the morning:YES   Patient reports excessive daytime hyper somnolence:YES     ROS:   Review of Systems - Negative except hpi    Parasomnia: Denies  Restless Leg Symptoms: Denies    PHYSICAL EXAMINATION:  Visit Vitals  BP 127/69 (BP 1 Location: Right arm, BP Patient Position: Sitting)   Pulse 84   Temp 97.8 ??F (36.6 ??C) (Oral)   Ht 5\' 8"  (1.727 m)   Wt 118.8 kg (262 lb)   SpO2 97%   BMI 39.84 kg/m??          Nose: no sinus tenderness, no erythma/induration or discharge, no nasal polyp.  Throat: no tonsillar enlargement/erythma or exudates, no oral thrush,   Neck: supple, no thyromegaly, no JVD, no lymphadenopathy, trachea midline.   Lung: CTAB, adequate air entry bilateral, no R/R/R/W.   Heart: S1 S2 present, no M/G/R.  Abdomen: soft, NT, ND, +BS.  LE: no pedal edema/cyanosis/clubbing.    LABS: Reviewed  Lab Results   Component Value Date/Time    TSH 1.15 03/06/2018 07:36 AM       CPAP Respironics REMstar auto  ????  PRESSURE    min 4 cm, max 20, 90% 11.3 CM  EPR     a flex 2 CM  LEAK    large leak 2 seconds  AHI     2.7 /HR  ????  %> 4HRS   73.3 % X 2 MOS  Pt has been compliant with use and benefit from device use.  Electronically signed by:   Minette Brine, MD  03/17/2018

## 2018-03-18 NOTE — Telephone Encounter (Signed)
Called patient's son with results of CT scan which is stable. No changes per Dr. Ellwood Dense.

## 2018-03-20 ENCOUNTER — Encounter: Payer: PRIVATE HEALTH INSURANCE | Attending: Internal Medicine | Primary: Internal Medicine

## 2018-03-20 ENCOUNTER — Encounter: Payer: PRIVATE HEALTH INSURANCE | Primary: Internal Medicine

## 2018-05-13 NOTE — Telephone Encounter (Signed)
Pt son is aware you are away. Wanted you to know that his Dad was in ER in [place] with very high heart rate. Was kept overnight and released, Please call son Iona Beard upon your return [phone]

## 2018-05-21 NOTE — Telephone Encounter (Signed)
PLEASE CALL SON, GEORGE, REGARDING THE RECORDS THAT WERE SCANNED IN. HE JUST WANTS TO MAKE SURE THAT ALL IS OK WITH HIS DAD EVEN THOUGH HE IS [phone].THANK YOU.

## 2018-06-16 NOTE — Telephone Encounter (Addendum)
PATIENT STILL OVERSEAS AND WAS PUT ON XARELTO ABOUT THREE WEEKS AGO. HE DOES NOT WANT TO TAKE THIS MEDICATION AND GO BACK ON THE ELIQUIS.  PLEASE CALL SON, GEORGE. HIS CONTACT # IS [phone].

## 2018-11-24 ENCOUNTER — Encounter: Attending: Medical Oncology | Primary: Internal Medicine

## 2018-11-24 ENCOUNTER — Ambulatory Visit
Admit: 2018-11-24 | Discharge: 2018-11-24 | Payer: PRIVATE HEALTH INSURANCE | Attending: Cardiovascular Disease | Primary: Internal Medicine

## 2018-11-24 ENCOUNTER — Ambulatory Visit: Attending: Cardiovascular Disease

## 2018-11-24 DIAGNOSIS — I480 Paroxysmal atrial fibrillation: Secondary | ICD-10-CM

## 2018-11-24 MED ORDER — RIVAROXABAN 20 MG TAB
20 mg | ORAL_TABLET | Freq: Every day | ORAL | 3 refills | Status: DC
Start: 2018-11-24 — End: 2019-02-04

## 2018-11-24 MED ORDER — CANDESARTAN 4 MG TAB
4 mg | ORAL_TABLET | Freq: Every day | ORAL | 3 refills | Status: DC
Start: 2018-11-24 — End: 2019-02-04

## 2018-11-24 MED ORDER — ROSUVASTATIN 20 MG TAB
20 mg | ORAL_TABLET | Freq: Every evening | ORAL | 3 refills | Status: DC
Start: 2018-11-24 — End: 2019-02-04

## 2018-11-24 NOTE — Progress Notes (Signed)
11/24/2018     Patient:  Bobby Guzman   DOB:  1961-02-17       CHIEF COMPLAINT:   Chief Complaint   Patient presents with   ??? Irregular Heart Beat     follow up          HISTORY OF PRESENT ILLNESS: Bobby Guzman presents for cardiology follow up. DOE chronic and unchanged. Denies CP, dizziness, palpitations.    Last spring while in [place] he had paroxysmal AF.     Also states he has lost weight with change in DM meds.    PAST MEDICAL HISTORY:  Past Medical History:   Diagnosis Date   ??? Diabetes ([place])    ??? Endocarditis 2012    dx [facility]; mitral valve; MV repair (Dr. Elio Forget [facility]) 11/16/15   ??? Essential hypertension    ??? Hyperlipidemia    ??? Lung cancer ([place]) 2011    s/p LUL resection and chemo   ??? Primary malignant neoplasm of left upper lobe of lung ([place])    ??? Sleep apnea    ??? Ulcerative colitis ([place])         PAST SURGICAL HISTORY:  Past Surgical History:   Procedure Laterality Date   ??? CHEST SURGERY PROCEDURE UNLISTED      left upper lobectomy   ??? HX HEART CATHETERIZATION     ??? HX MITRAL VALVULOPLASTY  11/16/2015    Mitral REPAIR and myomectomy with Dr. Andree Elk, [facility]   ??? HX OTHER SURGICAL  2011    LUL resection       MEDICATIONS:  Current Outpatient Medications   Medication Sig Dispense Refill   ??? omega-3 acid ethyl esters (LOVAZA) 1 gram capsule Take 2 g by mouth two (2) times a day.     ??? cyanocobalamin (VITAMIN B-12) 100 mcg tablet Take 100 mcg by mouth daily.     ??? ergocalciferol (VITAMIN D2) 50,000 unit capsule Take 50,000 Units by mouth.     ??? candesartan (ATACAND) 4 mg tablet Take 0.5 Tabs by mouth daily. 45 Tab 3   ??? rosuvastatin (CRESTOR) 20 mg tablet Take 1 Tab by mouth nightly. 90 Tab 3   ??? FREESTYLE LITE STRIPS strip USE TO TEST 4 TIMES A DAY AS DIRECTED E11.65  3   ??? metFORMIN (GLUCOPHAGE) 1,000 mg tablet TAKE ONE TAB BY MOUTH TWICE A DAY    **DO NOT RESTART METFORMIN UNTIL Sunday 11/05/15!!!!!!! 180 Tab 1   ??? insulin degludec (TRESIBA FLEXTOUCH U-100) 100 unit/mL (3 mL) inpn by  SubCUTAneous route.     ??? VICTOZA 3-PAK 0.6 mg/0.1 mL (18 mg/3 mL) sub-q pen 1.8 DAILY 90 DAYS SUPPLY DX:250.62  1   ??? sub-q insulin device, 40 unit devi by Does Not Apply route.          ALLERGIES:   No Known Allergies    SOCIAL HISTORY:  Social History     Socioeconomic History   ??? Marital status: MARRIED     Spouse name: Not on file   ??? Number of children: Not on file   ??? Years of education: Not on file   ??? Highest education level: Not on file   Tobacco Use   ??? Smoking status: Former Smoker     Packs/day: 2.00     Years: 34.00     Pack years: 68.00     Types: Cigarettes     Last attempt to quit: 02/20/2011     Years since quitting: 7.7   ???  Smokeless tobacco: Never Used   ??? Tobacco comment: e- cig no nicotine   Substance and Sexual Activity   ??? Alcohol use: Yes     Alcohol/week: 0.0 standard drinks     Comment: 2shots or beers /week   ??? Drug use: No         FAMILY HISTORY:  Family History   Problem Relation Age of Onset   ??? Stroke Mother    ??? Heart Attack Father    ??? High Cholesterol Sister    ??? Hypertension Sister    ??? High Cholesterol Brother    ??? Hypertension Brother        REVIEW OF SYSTEMS:  Neuro: No headache, focal weakness/numbness, parasthesias  Eyes: No visual disturbance  ENMT: No soar throat, nasal congestion, hearing loss, tinnitus  CV: No chest pain, palpitations, dizziness, syncope, claudication  Resp: dyspnea  GI: No abdominal pain, N/V, diarrhea, constipation  GU: No hematuria, dysuria  Skin: No rash  Heme: No bleeding, bruising  Constitutional: No fatigue, weakness, fever, weight change  Musculoskeletal: No myalgias, arthralgias  Other:    PHYSICAL EXAM:  Blood pressure 138/74, pulse 87, height 5\' 8"  (1.727 m), weight 252 lb (114.3 kg), SpO2 96 %.   Body mass index is 38.32 kg/m??.   General: Well developed, well nourished, no acute distress, appears stated age  Eyes: Anicteric, no hemorrhage, normal appearing conjunctivae and lids  ENMT: Normal externally  Neck: Supple, no JVD, no carotid bruit   CV: RRR, normal S1S2, no M/G/R  Lungs: CTA bilaterally, normal respiratory excursion  Abd: Soft, NT, ND, no palpable mass  Extremities: No pedal edema  Skin: Warm and dry  Musculoskeletal: Normal muscle tone, normal gait  Neuro: Nonfocal exam, no rigidity/tremor  Psych: Alert and oriented, appropriate affect      LABS AND TESTING:  ?? EKG: NSR, LBBB (unchanged)   ?? TEE 03/07/15 - nl LV/RV size/fxn, anterior MV leaflet with rupture, severe MR  ?? Cath 11/02/15 - no significant CAD, severe MR, LVEDP 21  ?? Echo 12/13/15 - moderate LVH, LVEF 50-55, severe LAE, nl RV, MV annuloplasty ring with trace MR  ?? 24 hr holter 12/13/15 - NSR 60-98 (avg 75), IVCD, occasional PVC, NSVT vs SVT (more likely) x 1 for 13 beats, no AF  ?? Echo 04/2017 - moderate LVH, LVEF 50-55, severe LAE, nl RV, MV annuloplasty ring with trace MR, trace AI  ?? Echo 02/2018 - TDS, mod LVH, cannot determine LVEF, nl RV, severe LAE, MV annuloplasty ring, trace AI  ?? Echo 03/2018 - Definity used, LVEF 50-55    ASSESSMENT AND PLAN:    1. HTN - Blood pressure is mildly elevated; prior visit well controlled. Continue current antihypertensive regimen.  2. Dyslipidemia - Recent lipids unknown. Continue current medications and follow up labs.   3. Hx endocarditis and subsequent MV rupture with severe MR - S/p MV repair 11/16/15. Clinically stable in this regard. Plan on serial echo and dental endocarditis ppx going forward.  4. Atrial fibrillation - Paroxysmal including recurrence 6 mo ago in [place]. Currently/primarily in sinus rhythm. CHADS-VASc 2. Start xarelto 20.  5.??OSA - Pulm followup. Uses CPAP sporadically.    [name], MD

## 2018-11-24 NOTE — Progress Notes (Signed)
11/24/2018     Patient:  Bobby Guzman   DOB:  [birth year]       CHIEF COMPLAINT:   Chief Complaint   Patient presents with   ??? Irregular Heart Beat     follow up          HISTORY OF PRESENT ILLNESS: Bobby Guzman presents for cardiology follow up. DOE chronic and unchanged. Denies CP, dizziness, palpitations.    Last spring while in [place] he had paroxysmal AF.     Also states he has lost weight with change in DM meds.    PAST MEDICAL HISTORY:  Past Medical History:   Diagnosis Date   ??? Diabetes ([place])    ??? Endocarditis 2012    dx [facility]; mitral valve; MV repair (Dr. Elio Guzman [facility]) 11/16/15   ??? Essential hypertension    ??? Hyperlipidemia    ??? Lung cancer ([place]) 2011    s/p LUL resection and chemo   ??? Primary malignant neoplasm of left upper lobe of lung ([place])    ??? Sleep apnea    ??? Ulcerative colitis ([place])         PAST SURGICAL HISTORY:  Past Surgical History:   Procedure Laterality Date   ??? CHEST SURGERY PROCEDURE UNLISTED      left upper lobectomy   ??? HX HEART CATHETERIZATION     ??? HX MITRAL VALVULOPLASTY  11/16/2015    Mitral REPAIR and myomectomy with Dr. Andree Guzman, [facility]   ??? HX OTHER SURGICAL  2011    LUL resection       MEDICATIONS:  Current Outpatient Medications   Medication Sig Dispense Refill   ??? omega-3 acid ethyl esters (LOVAZA) 1 gram capsule Take 2 g by mouth two (2) times a day.     ??? cyanocobalamin (VITAMIN B-12) 100 mcg tablet Take 100 mcg by mouth daily.     ??? ergocalciferol (VITAMIN D2) 50,000 unit capsule Take 50,000 Units by mouth.     ??? candesartan (ATACAND) 4 mg tablet Take 0.5 Tabs by mouth daily. 45 Tab 3   ??? rosuvastatin (CRESTOR) 20 mg tablet Take 1 Tab by mouth nightly. 90 Tab 3   ??? FREESTYLE LITE STRIPS strip USE TO TEST 4 TIMES A DAY AS DIRECTED E11.65  3   ??? metFORMIN (GLUCOPHAGE) 1,000 mg tablet TAKE ONE TAB BY MOUTH TWICE A DAY    **DO NOT RESTART METFORMIN UNTIL Sunday 11/05/15!!!!!!! 180 Tab 1   ??? insulin degludec (TRESIBA FLEXTOUCH U-100) 100 unit/mL (3 mL) inpn by  SubCUTAneous route.     ??? VICTOZA 3-PAK 0.6 mg/0.1 mL (18 mg/3 mL) sub-q pen 1.8 DAILY 90 DAYS SUPPLY DX:250.62  1   ??? sub-q insulin device, 40 unit devi by Does Not Apply route.          ALLERGIES:   No Known Allergies    SOCIAL HISTORY:  Social History     Socioeconomic History   ??? Marital status: MARRIED     Spouse name: Not on file   ??? Number of children: Not on file   ??? Years of education: Not on file   ??? Highest education level: Not on file   Tobacco Use   ??? Smoking status: Former Smoker     Packs/day: 2.00     Years: 34.00     Pack years: 68.00     Types: Cigarettes     Last attempt to quit: 02/20/2011     Years since quitting: 7.7   ???  Smokeless tobacco: Never Used   ??? Tobacco comment: e- cig no nicotine   Substance and Sexual Activity   ??? Alcohol use: Yes     Alcohol/week: 0.0 standard drinks     Comment: 2shots or beers /week   ??? Drug use: No         FAMILY HISTORY:  Family History   Problem Relation Age of Onset   ??? Stroke Mother    ??? Heart Attack Father    ??? High Cholesterol Sister    ??? Hypertension Sister    ??? High Cholesterol Brother    ??? Hypertension Brother        REVIEW OF SYSTEMS:  Neuro: No headache, focal weakness/numbness, parasthesias  Eyes: No visual disturbance  ENMT: No soar throat, nasal congestion, hearing loss, tinnitus  CV: No chest pain, palpitations, dizziness, syncope, claudication  Resp: dyspnea  GI: No abdominal pain, N/V, diarrhea, constipation  GU: No hematuria, dysuria  Skin: No rash  Heme: No bleeding, bruising  Constitutional: No fatigue, weakness, fever, weight change  Musculoskeletal: No myalgias, arthralgias  Other:    PHYSICAL EXAM:  Blood pressure 138/74, pulse 87, height 5\' 8"  (1.727 m), weight 252 lb (114.3 kg), SpO2 96 %.   Body mass index is 38.32 kg/m??.   General: Well developed, well nourished, no acute distress, appears stated age  Eyes: Anicteric, no hemorrhage, normal appearing conjunctivae and lids  ENMT: Normal externally  Neck: Supple, no JVD, no carotid  bruit  CV: RRR, normal S1S2, no M/G/R  Lungs: CTA bilaterally, normal respiratory excursion  Abd: Soft, NT, ND, no palpable mass  Extremities: No pedal edema  Skin: Warm and dry  Musculoskeletal: Normal muscle tone, normal gait  Neuro: Nonfocal exam, no rigidity/tremor  Psych: Alert and oriented, appropriate affect      LABS AND TESTING:  ?? EKG: NSR, LBBB (unchanged)   ?? TEE 03/07/15 - nl LV/RV size/fxn, anterior MV leaflet with rupture, severe MR  ?? Cath 11/02/15 - no significant CAD, severe MR, LVEDP 21  ?? Echo 12/13/15 - moderate LVH, LVEF 50-55, severe LAE, nl RV, MV annuloplasty ring with trace MR  ?? 24 hr holter 12/13/15 - NSR 60-98 (avg 75), IVCD, occasional PVC, NSVT vs SVT (more likely) x 1 for 13 beats, no AF  ?? Echo 04/2017 - moderate LVH, LVEF 50-55, severe LAE, nl RV, MV annuloplasty ring with trace MR, trace AI  ?? Echo 02/2018 - TDS, mod LVH, cannot determine LVEF, nl RV, severe LAE, MV annuloplasty ring, trace AI  ?? Echo 03/2018 - Definity used, LVEF 50-55    ASSESSMENT AND PLAN:    1. HTN - Blood pressure is mildly elevated; prior visit well controlled. Continue current antihypertensive regimen.  2. Dyslipidemia - Recent lipids unknown. Continue current medications and follow up labs.   3. Hx endocarditis and subsequent MV rupture with severe MR - S/p MV repair 11/16/15. Clinically stable in this regard. Plan on serial echo and dental endocarditis ppx going forward.  4. Atrial fibrillation - Paroxysmal including recurrence 6 mo ago in [place]. Currently/primarily in sinus rhythm. CHADS-VASc 2. Start xarelto 20.  5.??OSA - Pulm followup. Uses CPAP sporadically.    Bobby Bash, MD

## 2018-12-08 ENCOUNTER — Ambulatory Visit
Admit: 2018-12-08 | Discharge: 2018-12-08 | Payer: PRIVATE HEALTH INSURANCE | Attending: Medical Oncology | Primary: Internal Medicine

## 2018-12-08 ENCOUNTER — Ambulatory Visit

## 2018-12-08 DIAGNOSIS — C3412 Malignant neoplasm of upper lobe, left bronchus or lung: Secondary | ICD-10-CM

## 2018-12-08 NOTE — Progress Notes (Signed)
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      The patient was last seen by me on 03/10/2018 and prior to that on 04/15/2017, March 14, 2016 and October 17, 2015.  The patient did have a CAT scan of the chest abdomen pelvis on 03/17/2018 which was negative for any evidence of recurrence of his lung cancer.  Overall the patient feels well.  He had recent lab work earlier this month with his endocrinologist which was reviewed.  He continues on his various medications as noted.  He denies any hemoptysis.  He has some dyspnea on exertion which is chronic.  He is working hard to try and lose weight and has actually lost weight since his most recent visit in March 2019.  He tells me he is leaving for [place] over the next several days and may not be back for a year.  I have recommended that he have follow-up CAT scans in [place] if possible in April 2020.    As noted on 03/10/2018:   "..The patient had informed me that during his previous visit that he had  an MRI scan of his chest done in [place] on November 08, 2016.  The patient had translated the report for me and there was no evidence of recurrence of his lung cancer.    The patient has been in [place] and underwent endoscopy for fullness and discomfort of his upper abdomen with symptoms of gastroesophageal reflux.  From what I can gather he was told that he may have a hiatal hernia.  He was prescribed pantoprazole. He was seen by Dr. Ida Rogue last week due to increasing SOB and cough +phlegm. He denies hemoptysis.   He has not lost any weight. He reports he does not exercise and has not cut down caloric intakes.  He understands that he needs to lose weight.    The patient denies any symptoms of cough or hemoptysis.  After discussion it was decided that he will have a CAT scan of the chest abdomen and pelvis for surveillance of his lung cancer.  I have also recommended that he make an appointment to see the gastroenterologist.  He plans to travel to [place] in April and will return to the [place] in December 2019."      As noted 04/15/2017:  "His major problem relates to obesity.  He has reviewed this with his endocrinologist Dr. Sherril Croon who has suggested bariatric surgery.  The patient met with the bariatric surgeon and is very reluctant to pursue bariatric surgery.  He describes dyspnea on exertion.  The patient was a heavy smoker in  the past and I have recommended that he see a pulmonary specialist.  The patient was advised to pursue a program of caloric restriction to achieve weight loss.  He also had questions about taking testosterone injections as recommended by a physician in [place].  I have advised him to review this with his endocrinologist."    As noted on 03/14/2016:  "He informed me today that she had mitral valve heart surgery at [facility] on 11/16/2015 with Dr. Andree Elk.  His MediPort was removed on 11/15/2016.  He is feeling a lot better since his surgery.  She denies any  shortness of breath.  He denies any chest pain.  He denies any hemoptysis.  He is trying to lose weight.  He states that his ulcerative colitis acts up from time to time.  He denies any bright red blood per rectum.  He denies any fevers or chills.  The patient states that he had a CAT scan of the chest and an MRI at [facility] prior to his surgery on November 16, 2015."    As noted on 10/17/2015:  "The patient had CT scan on 03/23/15 as advised. He has not had the mediport removed as advised. He states he was in [place] and was worked up for a GI Bleed, possible ulcer and had 18 polyps removed during a colonoscopy. He states he was told that the polyps were "almost cancer". I have requested a translated copy of the records. He states he had lost weigh as advised but then gained it back in [place] while he was away on Business. He plans to follow up with his Cardiac Surgeons in [facility] and will discuss removal of the mediport. He has some dyspnea on exertion and an occasional cough. He denies any hemoptysis."    As noted on 03/17/15:  "The patient went for routine checkup and was found to have an abnormality in his left lung on chest x-ray.  This was followed by a CAT scan and further workup leading to surgical resection in [place].  The original pathology report is in  [place].  I have reviewed the translation and the patient had a moderately to well-differentiated squamous cell carcinoma in the left upper lobe excision specimen.  39 lymph nodes were negative for any evidence of metastatic spread: Stage IIB (pT3,pN0,M0).  The patient was not recommended any further adjuvant treatment in [place] but was seen in Medical Oncology consultation in [place] and was advised to have adjuvant chemotherapy.  He was seen in [place] by Dr. Charlott Holler who also recommended adjuvant chemotherapy with Navelbine and Cisplatin as per NCCN guidelines.  This was started on now 09/29/2010 and according to Dr.  Ansel Bong notes he had an elevated pretreatment CA-125 at 56 which eventually decreased to normal.  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  He had an inflamed anal fissure.  The patient travels back and forth between the [place] and [place] on business.  He still has a MediPort which he states he gets flushed once a year.  He has been recommended to have the MediPort removed but informed me that he was waiting for 5 years to be up before he would remove the MediPort.  He denies any fevers or chills.  The patient has surveillance scans every 6 months and is due for a CAT scan of the chest abdomen and pelvis which was last done in [place] in  September 2015 and in [place] in January 2015.    The patient denies any allergies.  He smoked at least 1-1/2-2 packs per day for several years and quit smoking 5 years ago.  He has a history of diabetes mellitus and is on insulin.  He has a history of hypertension and takes Atacand.  He developed mitral valve regurgitation following the episode of endocarditis and has symptoms of dyspnea on exertion.  He is overweight since he stopped smoking and is trying to lose weight.  He has seen Dr. Ida Rogue  from Cardiology and has been recommended to have mitral valve surgery which he plans to have this Fall.  He has a history of ulcerative colitis which has not been a problem since he had chemotherapy.  He has a history of sleep apnea and is known to have elevated lipids.  The patient was born in [place] and has lived in the [place] for the past 25 years.  He states that one half-sister died of some kind of cancer but he is unsure as as to the details.  He has 2 sons who are in good health.    His medications were reviewed."    DX   Encounter Diagnoses   Name Primary?   ??? Primary malignant neoplasm of left upper lobe of lung (HCC) Yes   ??? Obesity, morbid ([place])          Past Medical History:   Diagnosis Date   ??? Diabetes ([place])    ??? Endocarditis 2012    dx [facility]; mitral valve; MV repair ([facility]) 11/16/15   ??? Essential hypertension    ??? Hyperlipidemia    ??? Lung cancer ([place]) 2011    s/p LUL resection and chemo   ??? Primary malignant neoplasm of left upper lobe of lung ([place])    ??? Sleep apnea    ??? Ulcerative colitis ([place])       Past Surgical History:   Procedure Laterality Date   ??? CHEST SURGERY PROCEDURE UNLISTED      left upper lobectomy   ??? HX HEART CATHETERIZATION     ??? HX MITRAL VALVULOPLASTY  11/16/2015    Mitral REPAIR and myomectomy with Dr. Andree Elk, [facility]   ??? [place] OTHER SURGICAL  2011    LUL resection      Current Outpatient Medications   Medication Sig   ??? omega-3 acid ethyl esters (LOVAZA) 1 gram capsule Take 2 g by mouth two (2) times a day.   ??? cyanocobalamin (VITAMIN B-12) 100 mcg tablet Take 100 mcg by mouth daily.   ??? ergocalciferol (VITAMIN D2) 50,000 unit capsule Take 50,000 Units by mouth.   ??? candesartan (ATACAND) 4 mg tablet Take 0.5 Tabs by mouth daily.   ??? rosuvastatin (CRESTOR) 20 mg tablet Take 1 Tab by mouth nightly.   ??? rivaroxaban (XARELTO) 20 mg tab tablet Take 1 Tab by mouth daily (with dinner).   ??? FREESTYLE LITE STRIPS strip USE TO TEST 4 TIMES A DAY AS DIRECTED E11.65   ??? metFORMIN (GLUCOPHAGE) 1,000 mg tablet TAKE ONE TAB BY MOUTH TWICE A DAY    **DO NOT RESTART METFORMIN UNTIL Sunday 11/05/15!!!!!!!   ??? insulin degludec (TRESIBA FLEXTOUCH U-100) 100 unit/mL (3 mL) inpn by SubCUTAneous route.   ??? VICTOZA 3-PAK 0.6 mg/0.1 mL (18 mg/3 mL) sub-q pen 1.8 DAILY 90 DAYS SUPPLY DX:250.62   ??? sub-q insulin device, 40 unit devi by Does Not Apply route.  No current facility-administered medications for this visit.       No Known Allergies   Social History     Tobacco Use   ??? Smoking status: Former Smoker     Packs/day: 2.00     Years: 34.00     Pack years: 68.00     Types: Cigarettes     Last attempt to quit: 02/20/2011      Years since quitting: 7.8   ??? Smokeless tobacco: Never Used   ??? Tobacco comment: e- cig no nicotine   Substance Use Topics   ??? Alcohol use: Yes     Alcohol/week: 0.0 standard drinks     Comment: 2shots or beers /week      Family History   Problem Relation Age of Onset   ??? Stroke Mother    ??? Heart Attack Father    ??? High Cholesterol Sister    ??? Hypertension Sister    ??? High Cholesterol Brother    ??? Hypertension Brother         Review of Systems:  A detailed 10 organ review of systems is obtained with pertinent positives as listed in the History of Present Illness and Past Medical History. All others are negative.    Physical Exam:  Visit Vitals  BP 148/73   Pulse 83   Temp 97.6 ??F (36.4 ??C) (Oral)   Ht '5\' 8"'$  (1.727 m)   Wt 256 lb (116.1 kg)   BMI 38.92 kg/m??       General appearance  WD, WN, be Caucasian male, alert, cooperative, no distress, appears stated age   Head  Normocephalic, without obvious abnormality, atraumatic   Eyes  PERRLA   Nose Nares normal. Septum midline. Mucosa normal. No drainage or sinus tenderness.   Throat Lips, mucosa, and tongue normal. Teeth and gums normal.  Oropharynx clear.   Neck supple, symmetrical, trachea midline, no adenopathy, thyroid: not enlarged, symmetric, no tenderness/mass/nodules,no JVD   Back   No midline or CVA tenderness   Lungs   clear to auscultation bilaterally   Heart  regular rate and rhythm, S1, S2 normal, note IV/VI holosystolic at apex    Abdomen   soft, obese, non-tender, non-distended. Bowel sounds normal. No masses,  No organomegaly   Extremities extremities normal, atraumatic, no cyanosis or edema   Skin Skin color, texture, turgor normal. No rashes or lesions   Lymph nodes Nonpalpable   Neurologic Nonfocal       Labs:   No results found for this or any previous visit (from the past 24 hour(s)).        Impression and Plan:  The patient appears to be clinically in remission with regards to his  history of squamous cell carcinoma of the lung.  He had a CAT scan of the chest abdomen pelvis earlier this year on 03/17/2018.  I have recommended that he have a follow-up staging CAT scan next year in April 2020 and he may consider getting this done in [place] since he will be leaving for [place] over the next several days and may not return until next December.    The patient was complemented on his recent weight loss and he was advised to continue to pursue a program of dietary caloric restriction. He was advised continued follow-up follow-up with his endocrinologist and all his physicians.         ICD-10-CM ICD-9-CM    1. Primary malignant neoplasm of left upper lobe of lung (HCC) C34.12 162.3  2. Obesity, morbid ([place]) E66.01 278.01      Follow-up and Dispositions    ?? Return in about 6 months (around 06/09/2019) for Follow-up.       Neysa Hotter, MD, FACP  Director, [place] [facility]  Chief, Sub-Section of Medical Oncology,  [facility], [place] [address]        Medical Group  Member of the [facility]  [address], [place]  [address]  P: [place]   F: [phone]      12/08/2018

## 2018-12-08 NOTE — Patient Instructions (Addendum)
Lung Cancer: Care Instructions  Your Care Instructions    Lung cancer occurs when abnormal cells grow out of control in the lung. Lung cancer can start anywhere in the lungs and spread to other parts of the body.  Treatment for lung cancer depends on what type of lung cancer you have and how advanced it is. Treatment may include surgery to remove the cancer. It could also include medicines (chemotherapy) or radiation to destroy cancer cells.  Being treated for cancer can weaken your body, and you may feel very tired. Home treatment and certain medicines can help you feel better.  Finding out that you have cancer is scary. You may feel many emotions and may need some help coping. Seek out family, friends, and counselors for support. You also can do things at home to make yourself feel better while you go through treatment. Call the American Cancer Society (1-800-227-2345) or visit its website at www.cancer.org for more information.  Follow-up care is a key part of your treatment and safety. Be sure to make and go to all appointments, and call your doctor if you are having problems. It's also a good idea to know your test results and keep a list of the medicines you take.  How can you care for yourself at home?  ?? Take your medicines exactly as prescribed. Call your doctor if you think you are having a problem with your medicine. You will get more details on the specific medicines your doctor prescribes.  ?? Follow your doctor's instructions to relieve pain. Use pain medicine when you first feel pain, before it becomes severe. Taking pain medicines regularly is often the best way to keep pain under control.  ?? Eat healthy food. If you do not feel like eating, try to eat food that has protein and extra calories to keep up your strength and prevent weight loss. Drink liquid meal replacements for extra calories and protein. Try to eat your main meal early. Eating smaller portions more often may help as well.   ?? Get some physical activity every day, but do not get too tired. Keep doing the hobbies you enjoy as your energy allows.  ?? Do not smoke. Smoking can make your cancer symptoms worse. If you need help quitting, talk to your doctor about stop-smoking programs and medicines. These can increase your chances of quitting for good.  ?? If you use oxygen, do not smoke, light a cigarette, or use a flame while your oxygen is on. Smoking while using oxygen can lead to fire and even explosion.  ?? If you have nausea, try to eat several small meals a day. When you feel better, eat clear soups and mild foods until all symptoms are gone for 12 to 48 hours. Other good choices include dry toast, crackers, cooked cereal, and gelatin dessert, such as Jell-O.  ?? If you are vomiting or have diarrhea:  ? Drink plenty of fluids (enough so that your urine is light yellow or clear like water) to prevent dehydration. Choose water and other caffeine-free clear liquids. If you have kidney, heart, or liver disease and have to limit fluids, talk with your doctor before you increase the amount of fluids you drink.  ? When you are able to eat, try clear soups, mild foods, and liquids until all symptoms are gone for 12 to 48 hours. Other good choices include dry toast, crackers, cooked cereal, and gelatin dessert, such as Jell-O.  ?? Take steps to control your stress and   workload. Learn relaxation techniques.  ? Share your feelings. Stress and tension affect our emotions. By expressing your feelings to others, you may be able to understand and cope with them.  ? Consider joining a support group. Talking about a problem with your spouse, a good friend, or other people with similar problems is a good way to reduce tension and stress.  ? Express yourself with art. Try writing, crafts, dance, or art to relieve stress. Some dance, writing, or art groups may be available just for people who have cancer.   ? Be kind to your body and mind. Getting enough sleep, eating a healthy diet, and taking time to do things you enjoy can contribute to an overall feeling of balance in your life and help reduce stress.  ? Get help if you need it. Discuss your concerns with your doctor or counselor.  ?? If you have not already done so, prepare a list of advance directives. Advance directives are instructions to your doctor and family members about what kind of care you want if you become unable to speak or express yourself.  When should you call for help?  Call 911 anytime you think you may need emergency care. For example, call if:  ?? ?? You passed out (lost consciousness).   ?? ?? You have severe trouble breathing.   ?? ?? You cough up a lot of blood.   ??Call your doctor now or seek immediate medical care if:  ?? ?? You have a fever.   ?? ?? You are short of breath.   ?? ?? You have new or worse pain.   ?? ?? You have a new or worse cough.   ?? ?? You think you have an infection.   ??Watch closely for changes in your health, and be sure to contact your doctor if:  ?? ?? You do not get better as expected.   Where can you learn more?  Go to [email or web address].  Enter [phone] in the search box to learn more about "Lung Cancer: Care Instructions."  Current as of: December 03, 2017  Content Version: 12.2  ?? 2006-2019 [employer]. Care instructions adapted under license by [employer] (which disclaims liability or warranty for this information). If you have questions about a medical condition or this instruction, always ask your healthcare professional. [place] any warranty or liability for your use of this information.

## 2018-12-08 NOTE — Progress Notes (Signed)
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        The patient was last seen by me on 03/10/2018 and prior to that on 04/15/2017, March 14, 2016 and October 17, 2015.  The patient did have a CAT scan of the chest abdomen pelvis on 03/17/2018 which was negative for any evidence of recurrence of his lung cancer.   Overall the patient feels well.  He had recent lab work earlier this month with his endocrinologist which was reviewed.  He continues on his various medications as noted.  He denies any hemoptysis.  He has some dyspnea on exertion which is chronic.   He is working hard to try and lose weight and has actually lost weight since his most recent visit in March 2019.  He tells me he  is leaving for [place] over the next several days and may not be back for a year.  I have recommended that he have follow-up  CAT scans in [place] if possible in April 2020.      As noted on 03/10/2018:    "..The patient had informed me that during his previous visit that he had an MRI scan of his chest done in [place] on November 08, 2016.  The patient had translated the report for me and there was no evidence of recurrence of his lung cancer.      The patient has been in [place] and underwent endoscopy for fullness and discomfort of his upper abdomen with symptoms of gastroesophageal reflux.  From what I can gather he was told that he may have a hiatal hernia.  He was prescribed pantoprazole. He  was seen by Dr. Ida Rogue last week due to increasing SOB and cough +phlegm. He denies hemoptysis.   He has not lost any weight. He reports he does not exercise and has not cut down caloric intakes.  He understands that he needs to lose weight.      The patient denies any symptoms of cough or hemoptysis.  After discussion it was decided that he will have a CAT  scan of the chest abdomen and pelvis for surveillance of his lung cancer.  I have also recommended that he make an appointment to see the  gastroenterologist.  He plans to travel to [place] in April and will return to the [place] in December 2019."         As noted 04/15/2017:   "His major problem relates to obesity.  He has reviewed this with his endocrinologist Dr. Sherril Croon who has suggested bariatric surgery.  The patient met with the bariatric surgeon and is very reluctant to pursue bariatric surgery.  He describes dyspnea  on exertion.  The patient was a heavy smoker in the past and I have recommended that he see a pulmonary specialist.  The patient was advised to pursue a program of caloric restriction to achieve weight loss.  He also had questions about taking testosterone  injections as recommended by a physician in [place].  I have advised him to review this with his  endocrinologist."      As noted on 03/14/2016:   "He informed me today that she had mitral valve heart surgery at [facility] on 11/16/2015 with Dr. Andree Elk.  His MediPort was removed on 11/15/2016.  He is feeling a lot better since his surgery.  She denies any shortness of breath.  He denies any  chest pain.  He denies any hemoptysis.  He is trying to lose weight.  He states that his ulcerative colitis acts up from time to time.  He denies any bright red blood per rectum.  He denies any fevers or chills.  The patient states that he had a CAT scan  of the chest and an MRI at [facility] prior to his surgery on November 16, 2015."      As noted on 10/17/2015:   "The patient had CT scan on 03/23/15 as advised. He has not had the mediport removed as advised. He states he was in [place] and was worked up for a GI Bleed, possible ulcer and had 18 polyps removed during a colonoscopy. He states he was told that the polyps  were "almost cancer". I have requested a translated copy of the records. He states he had lost weigh as advised but then gained it back in [place] while he was away on Business. He plans to follow up with his Cardiac Surgeons in [facility] and will discuss removal  of the mediport. He has some dyspnea on exertion and an occasional cough. He denies any hemoptysis."      As noted on 03/17/15:   "The patient went for routine checkup and was found to have an abnormality in his left lung on chest x-ray.  This was followed by a CAT scan and further workup leading to surgical resection in [place].  The original pathology report is in  [place].  I  have reviewed the translation and the patient had a moderately to well-differentiated squamous cell carcinoma in the left upper lobe excision specimen.  39 lymph nodes were negative for any evidence of metastatic spread: Stage IIB (pT3,pN0,M0).  The patient  was not recommended any further adjuvant treatment in [place] but was seen in Medical Oncology consultation in  [place] and was advised to have adjuvant chemotherapy.  He was seen in [place] by Dr. Charlott Holler who also recommended adjuvant chemotherapy  with Navelbine and Cisplatin as per NCCN guidelines.  This was started on now 09/29/2010 and according to Dr. Ansel Bong notes he had an elevated pretreatment CA-125 at  1 which eventually decreased to normal.  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  He had an inflamed anal fissure.  The patient travels back and forth between the [place] and [place] on business.  He still has a MediPort  which he states he gets flushed once a year.  He has been recommended to have the MediPort removed but informed me that he was waiting for 5 years to be up before he would remove the MediPort.  He denies any fevers or chills.  The patient has surveillance  scans every 6 months and is due for a CAT scan of the chest abdomen and pelvis which was last done in [place] in September 2015 and in [place] in January 2015.      The patient denies any allergies.  He smoked at least 1-1/2-2 packs per day for several years and quit smoking 5 years ago.  He has a history of diabetes mellitus and is on insulin.  He has a history of hypertension and takes Atacand.  He developed mitral  valve regurgitation following the episode of endocarditis and has symptoms of dyspnea on exertion.  He is overweight since he stopped smoking and is trying to lose weight.  He has seen Dr. Ida Rogue  from Cardiology and has been recommended to have mitral  valve surgery which he plans to have this Fall.  He has a history of ulcerative colitis which has not been a problem since he had chemotherapy.  He has a history of sleep apnea and is known to have elevated lipids.  The patient was born in [place] and  has lived in the [place] for the past 25 years.  He states that one half-sister died of some kind  of cancer but he is unsure as as to the details.  He has 2 sons who are in good health.      His medications were reviewed."      DX      Encounter Diagnoses        Name  Primary?         ?  Primary malignant neoplasm of left upper lobe of lung ([place])  Yes         ?  Obesity, morbid [facility])                Past Medical History:        Diagnosis  Date         ?  Diabetes ([place])       ?  Endocarditis  2012          dx [facility]; mitral valve; MV repair (Dr. Elio Forget [facility]) 11/16/15         ?  Essential hypertension       ?  Hyperlipidemia       ?  Lung cancer ([place])  2011          s/p LUL resection and chemo         ?  Primary malignant neoplasm of left upper lobe of lung ([place])       ?  Sleep apnea           ?  Ulcerative colitis [facility])             Past Surgical History:         Procedure  Laterality  Date          ?  CHEST SURGERY PROCEDURE UNLISTED              left upper lobectomy          ?  HX HEART CATHETERIZATION         ?  HX MITRAL VALVULOPLASTY    11/16/2015          Mitral REPAIR and myomectomy with Dr. Andree Elk, [facility]          ?  HX OTHER SURGICAL    2011          LUL resection           Current Outpatient Medications        Medication  Sig         ?  omega-3 acid ethyl esters (LOVAZA) 1 gram capsule  Take 2 g by mouth two (2) times a day.     ?  cyanocobalamin (VITAMIN B-12) 100 mcg tablet  Take 100 mcg by mouth daily.     ?  ergocalciferol (VITAMIN D2) 50,000 unit capsule  Take 50,000 Units by mouth.     ?  candesartan (ATACAND) 4 mg tablet  Take 0.5 Tabs by mouth daily.     ?  rosuvastatin (CRESTOR) 20 mg tablet  Take 1 Tab by mouth nightly.     ?  rivaroxaban (XARELTO) 20 mg tab tablet  Take 1 Tab by mouth daily (with dinner).     ?  FREESTYLE LITE STRIPS strip  USE TO TEST 4 TIMES A DAY AS DIRECTED E11.65     ?  metFORMIN (GLUCOPHAGE) 1,000 mg tablet  TAKE ONE TAB BY MOUTH TWICE A DAY      **DO NOT RESTART METFORMIN UNTIL Sunday 11/05/15!!!!!!!     ?  insulin degludec (TRESIBA FLEXTOUCH U-100) 100  unit/mL (3 mL) inpn  by SubCUTAneous route.     ?  VICTOZA 3-PAK 0.6 mg/0.1 mL (18 mg/3 mL) sub-q pen  1.8 DAILY 90 DAYS SUPPLY DX:250.62         ?  sub-q insulin device, 40 unit devi  by Does Not Apply route.          No current facility-administered medications for this visit.          No Known Allergies      Social History          Tobacco Use         ?  Smoking status:  Former Smoker              Packs/day:  2.00         Years:  34.00         Pack years:  68.00         Types:  Cigarettes         Last attempt to quit:  02/20/2011         Years since quitting:  7.8         ?  Smokeless tobacco:  Never Used        ?  Tobacco comment: e- cig no nicotine       Substance Use Topics         ?  Alcohol use:  Yes              Alcohol/week:  0.0 standard drinks             Comment: 2shots or beers /week           Family  History         Problem  Relation  Age of Onset          ?  Stroke  Mother       ?  Heart Attack  Father       ?  High Cholesterol  Sister       ?  Hypertension  Sister       ?  High Cholesterol  Brother            ?  Hypertension  Brother              Review of Systems:   A detailed 10 organ review of systems is obtained with pertinent positives as listed in the History of Present Illness and Past Medical History. All others are negative.      Physical Exam:   Visit Vitals      BP  148/73     Pulse  83     Temp  97.6 ??F (36.4 ??C) (Oral)     Ht  5' 8"  (1.727 m)     Wt  256 lb (116.1 kg)        BMI  38.92 kg/m??              General appearance   WD, WN, be Caucasian male, alert, cooperative, no distress, appears stated age     Head   Normocephalic, without obvious abnormality, atraumatic     Eyes   PERRLA     Nose  Nares normal. Septum midline. Mucosa normal. No drainage or sinus tenderness.        Throat  Lips, mucosa, and tongue normal. Teeth and gums normal.  Oropharynx clear.        Neck  supple, symmetrical, trachea midline, no adenopathy, thyroid: not enlarged, symmetric, no tenderness/mass/nodules,no  JVD     Back    No midline or CVA tenderness     Lungs    clear to auscultation bilaterally     Heart   regular rate and rhythm, S1, S2 normal, note IV/VI holosystolic at apex      Abdomen    soft, obese, non-tender, non-distended. Bowel sounds normal. No masses,  No organomegaly     Extremities  extremities normal, atraumatic, no cyanosis or edema        Skin  Skin color, texture, turgor normal. No rashes or lesions        Lymph nodes  Nonpalpable     Neurologic  Nonfocal           Labs:    No results found for this or any previous visit (from the past 24 hour(s)).            Impression and Plan:   The patient appears to be clinically in remission with regards to his history of squamous cell carcinoma of the lung.  He had a CAT scan of the chest abdomen pelvis earlier this year on 03/17/2018.  I  have recommended that he have a follow-up staging CAT scan next year in April 2020 and he may consider getting this done in [place] since he will be leaving for [place] over the next several days and may not return until next December.      The patient was complemented on his recent weight loss and he was advised to continue to pursue a program of dietary caloric restriction. He was advised continued follow-up follow-up with his endocrinologist and all his physicians.  ICD-10-CM  ICD-9-CM             1.  Primary malignant neoplasm of left upper lobe of lung (HCC)  C34.12  162.3             2.  Obesity, morbid ([place])  E66.01  278.01            Follow-up and Dispositions      ??  Return in about 6 months (around 06/09/2019) for Follow-up.             Neysa Hotter, MD, FACP   Director, [place] [facility]   Chief, Sub-Section of Medical Oncology,   [facility], [place] [address]          [facility]   Member of the [facility]   [address] [place], [place]  [address]   P: [place]   F: [phone]           12/08/2018

## 2019-02-04 ENCOUNTER — Encounter

## 2019-02-04 MED ORDER — CANDESARTAN 4 MG TAB
4 mg | ORAL_TABLET | Freq: Every day | ORAL | 3 refills | Status: DC
Start: 2019-02-04 — End: 2019-05-05

## 2019-02-04 MED ORDER — ROSUVASTATIN 20 MG TAB
20 mg | ORAL_TABLET | Freq: Every evening | ORAL | 3 refills | Status: DC
Start: 2019-02-04 — End: 2019-05-05

## 2019-02-04 MED ORDER — RIVAROXABAN 20 MG TAB
20 mg | ORAL_TABLET | Freq: Every day | ORAL | 3 refills | Status: DC
Start: 2019-02-04 — End: 2019-05-05

## 2019-02-04 NOTE — Telephone Encounter (Signed)
Refill  Candesartan ,  Xarelto, Rosuvastatin  #90 to [facility]

## 2019-05-05 ENCOUNTER — Telehealth

## 2019-05-05 MED ORDER — RIVAROXABAN 20 MG TAB
20 mg | ORAL_TABLET | Freq: Every day | ORAL | 1 refills | Status: DC
Start: 2019-05-05 — End: 2019-10-20

## 2019-05-05 MED ORDER — ROSUVASTATIN 20 MG TAB
20 mg | ORAL_TABLET | Freq: Every evening | ORAL | 1 refills | Status: DC
Start: 2019-05-05 — End: 2019-10-20

## 2019-05-05 MED ORDER — CANDESARTAN 4 MG TAB
4 mg | ORAL_TABLET | Freq: Every day | ORAL | 1 refills | Status: DC
Start: 2019-05-05 — End: 2019-10-20

## 2019-05-05 NOTE — Telephone Encounter (Signed)
Request completed.

## 2019-05-05 NOTE — Telephone Encounter (Signed)
Son called for refill - patient  Is currently out of country- unknown when he will return     [facility] [place]     candesartan (ATACAND) 4 mg tablet    rivaroxaban (XARELTO) 20 mg tab tablet    rosuvastatin (CRESTOR) 20 mg tablet    Thank you

## 2019-07-19 ENCOUNTER — Encounter

## 2019-07-19 NOTE — Telephone Encounter (Signed)
Refill  Candesartan, Xarelto,  Rosuvastatin  90 days to [facility]

## 2019-10-07 NOTE — Telephone Encounter (Signed)
Son called, patient is still overseas & will most likely not return until February 2021.    He is in need of refills & a follow up.    Do you want to set up a TELEHEALTH ?     Please advise     Thank you     Son# [phone]

## 2019-10-20 ENCOUNTER — Telehealth

## 2019-10-20 MED ORDER — CANDESARTAN 4 MG TAB
4 mg | ORAL_TABLET | Freq: Every day | ORAL | 1 refills | Status: DC
Start: 2019-10-20 — End: 2020-07-19

## 2019-10-20 MED ORDER — ROSUVASTATIN 20 MG TAB
20 mg | ORAL_TABLET | Freq: Every evening | ORAL | 1 refills | Status: DC
Start: 2019-10-20 — End: 2020-07-19

## 2019-10-20 MED ORDER — RIVAROXABAN 20 MG TAB
20 mg | ORAL_TABLET | Freq: Every day | ORAL | 1 refills | Status: DC
Start: 2019-10-20 — End: 2020-04-19

## 2019-10-20 NOTE — Telephone Encounter (Signed)
Refill     candesartan (ATACAND) 4 mg tablet    rivaroxaban (Xarelto) 20 mg tab tablet     rosuvastatin (CRESTOR) 20 mg tablet       Patient is out of the country, per Dr Ida Rogue  We can refill his medications until February 2021. (noted in telephone encounter on 10/07/2019    Thank you

## 2019-10-20 NOTE — Telephone Encounter (Signed)
Left message for son. If telehealth is feasible we can try to set an appoitnment. If son feels, it cannot be done due to the distance- he can call & we will refill until February 2021.    Awaiting his response

## 2020-04-18 NOTE — Telephone Encounter (Signed)
Refill  All cardiac  meds 3 mos [facility]

## 2020-04-18 NOTE — Telephone Encounter (Signed)
Denied - See telephone encounter from 10/2019

## 2020-04-18 NOTE — Telephone Encounter (Signed)
Denied - See telephone encounter from 10/20/19

## 2020-04-18 NOTE — Telephone Encounter (Signed)
rosuvastatin (CRESTOR) 20 mg tablet  candesartan (ATACAND) 4 mg tablet  rivaroxaban (Xarelto) 20 mg tab tablet    [facility]

## 2020-04-19 MED ORDER — RIVAROXABAN 20 MG TAB
20 mg | ORAL_TABLET | Freq: Every day | ORAL | 0 refills | Status: DC
Start: 2020-04-19 — End: 2020-05-16

## 2020-04-19 NOTE — Telephone Encounter (Signed)
XARELTO 20 MGS OD #90 1X.  [facility].

## 2020-05-16 MED ORDER — XARELTO 20 MG TABLET
20 mg | ORAL_TABLET | ORAL | 0 refills | Status: DC
Start: 2020-05-16 — End: 2020-07-19

## 2020-07-14 NOTE — Telephone Encounter (Signed)
Patient is still overseas, next window to return to the [place] November Bobby Guzman 2021    [facility] [place]       candesartan (ATACAND) 4 mg tablet     Xarelto 20 mg tab tablet    rosuvastatin (CRESTOR) 20 mg tablet    Will you refill these until he returns?

## 2020-07-19 ENCOUNTER — Encounter

## 2020-07-19 MED ORDER — RIVAROXABAN 20 MG TAB
20 mg | ORAL_TABLET | ORAL | 1 refills | Status: DC
Start: 2020-07-19 — End: 2020-12-26

## 2020-07-19 MED ORDER — CANDESARTAN 4 MG TAB
4 mg | ORAL_TABLET | Freq: Every day | ORAL | 1 refills | Status: DC
Start: 2020-07-19 — End: 2020-12-26

## 2020-07-19 MED ORDER — ROSUVASTATIN 20 MG TAB
20 mg | ORAL_TABLET | Freq: Every evening | ORAL | 1 refills | Status: DC
Start: 2020-07-19 — End: 2020-12-26

## 2020-11-21 ENCOUNTER — Encounter: Attending: Cardiovascular Disease | Primary: Internal Medicine

## 2020-12-26 ENCOUNTER — Ambulatory Visit
Admit: 2020-12-26 | Discharge: 2020-12-26 | Payer: PRIVATE HEALTH INSURANCE | Attending: Cardiovascular Disease | Primary: Internal Medicine

## 2020-12-26 ENCOUNTER — Ambulatory Visit: Attending: Cardiovascular Disease

## 2020-12-26 DIAGNOSIS — I480 Paroxysmal atrial fibrillation: Secondary | ICD-10-CM

## 2020-12-26 MED ORDER — ROSUVASTATIN 20 MG TAB
20 mg | ORAL_TABLET | Freq: Every evening | ORAL | 3 refills | Status: DC
Start: 2020-12-26 — End: 2021-03-19

## 2020-12-26 MED ORDER — RIVAROXABAN 20 MG TAB
20 mg | ORAL_TABLET | ORAL | 3 refills | Status: DC
Start: 2020-12-26 — End: 2021-03-19

## 2020-12-26 MED ORDER — CANDESARTAN 4 MG TAB
4 mg | ORAL_TABLET | Freq: Every day | ORAL | 3 refills | Status: DC
Start: 2020-12-26 — End: 2021-03-19

## 2020-12-26 NOTE — Progress Notes (Signed)
12/26/2020     Patient:  Gracelyn Nurse   DOB:  1961-07-20       CHIEF COMPLAINT:   Chief Complaint   Patient presents with   ??? Hypertension   ??? Irregular Heart Beat         HISTORY OF PRESENT ILLNESS: Angas Coopersmith presents for cardiology follow up. Last visit with me ~2 yrs ago. 3 episodes of palpitations requiring ER visits; found to have AF at those times; each time it converts spontaneously. Between those episodes he feels well overall. DOE chronic and unchanged. Denies CP or dizziness.    Was in [place] due to pandemic.     Considering bariatric surgery.    PAST MEDICAL HISTORY:  Past Medical History:   Diagnosis Date   ??? Diabetes ([place])    ??? Endocarditis 2012    dx [facility]; mitral valve; MV repair (Dr. Elio Forget [facility]) 11/16/15   ??? Essential hypertension    ??? Hyperlipidemia    ??? Lung cancer ([place]) 2011    s/p LUL resection and chemo   ??? Primary malignant neoplasm of left upper lobe of lung ([place])    ??? Sleep apnea    ??? Ulcerative colitis ([place])         PAST SURGICAL HISTORY:  Past Surgical History:   Procedure Laterality Date   ??? HX HEART CATHETERIZATION     ??? HX MITRAL VALVULOPLASTY  11/16/2015    Mitral REPAIR and myomectomy with Dr. Andree Elk, [facility]   ??? HX OTHER SURGICAL  2011    LUL resection   ??? PR CHEST SURGERY PROCEDURE UNLISTED      left upper lobectomy       MEDICATIONS:  Current Outpatient Medications   Medication Sig Dispense Refill   ??? Farxiga 10 mg tab tablet      ??? FreeStyle Libre 14 Day Sensor kit      ??? semaglutide (OZEMPIC SC) by SubCUTAneous route.     ??? rivaroxaban (Xarelto) 20 mg tab tablet TAKE 1 TAB BY MOUTH DAILY (WITH DINNER). 90 Tablet 1   ??? candesartan (ATACAND) 4 mg tablet Take 0.5 Tablets by mouth daily. 45 Tablet 1   ??? rosuvastatin (CRESTOR) 20 mg tablet Take 1 Tablet by mouth nightly. 90 Tablet 1   ??? omega-3 acid ethyl esters (LOVAZA) 1 gram capsule Take 2 g by mouth two (2) times a day.     ??? cyanocobalamin (VITAMIN B-12) 100 mcg tablet Take 100 mcg by mouth daily.     ???  ergocalciferol (VITAMIN D2) 50,000 unit capsule Take 50,000 Units by mouth.     ??? FREESTYLE LITE STRIPS strip USE TO TEST 4 TIMES A DAY AS DIRECTED E11.65  3   ??? sub-q insulin device, 40 unit devi by Does Not Apply route.          ALLERGIES:   No Known Allergies    SOCIAL HISTORY:  Social History     Socioeconomic History   ??? Marital status: MARRIED   Tobacco Use   ??? Smoking status: Former Smoker     Packs/day: 2.00     Years: 34.00     Pack years: 68.00     Types: Cigarettes     Quit date: 02/20/2011     Years since quitting: 9.8   ??? Smokeless tobacco: Never Used   ??? Tobacco comment: e- cig no nicotine   Substance and Sexual Activity   ??? Alcohol use: Yes     Alcohol/week:  0.0 standard drinks     Comment: 2shots or beers /week   ??? Drug use: No         FAMILY HISTORY:  Family History   Problem Relation Age of Onset   ??? Stroke Mother    ??? Heart Attack Father    ??? High Cholesterol Sister    ??? Hypertension Sister    ??? High Cholesterol Brother    ??? Hypertension Brother        REVIEW OF SYSTEMS:  Neuro: No headache, focal weakness/numbness, parasthesias  Eyes: No visual disturbance  ENMT: No soar throat, nasal congestion, hearing loss, tinnitus  CV: No chest pain, palpitations, dizziness, syncope, claudication  Resp: No dyspnea, cough  GI: No abdominal pain, N/V, diarrhea, constipation  GU: No hematuria, dysuria  Skin: No rash  Heme: No bleeding, bruising  Constitutional: No fatigue, weakness, fever, weight change  Musculoskeletal: No myalgias, arthralgias  Other:    PHYSICAL EXAM:  Blood pressure 120/70, pulse 69, height 5' 8"  (1.727 m), weight 251 lb (113.9 kg), SpO2 97 %.   Body mass index is 38.16 kg/m??.   General: Well developed, well nourished, no acute distress, appears stated age  Eyes: Anicteric, no hemorrhage, normal appearing conjunctivae and lids  ENMT: Normal externally  Neck: Supple, no JVD, no carotid bruit  CV: RRR, normal S1S2, no M/G/R  Lungs: CTA bilaterally, normal respiratory excursion  Abd: Soft, NT,  ND, no palpable mass  Extremities: No pedal edema  Skin: Warm and dry  Musculoskeletal: Normal muscle tone, normal gait  Neuro: Nonfocal exam, no rigidity/tremor  Psych: Alert and oriented, appropriate affect      LABS AND TESTING:  ?? EKG: NSR, LBBB  ?? TEE 03/07/15 - nl LV/RV size/fxn, anterior MV leaflet with rupture, severe MR  ?? Cath 11/02/15 - no significant CAD, severe MR, LVEDP 21  ?? Echo 12/13/15 - moderate LVH, LVEF 50-55, severe LAE, nl RV, MV annuloplasty ring with trace MR  ?? 24 hr holter 12/13/15 - NSR 60-98 (avg 75), IVCD, occasional PVC, NSVT vs SVT (more likely) x 1 for 13 beats, no AF  ?? Echo 04/2017 - moderate LVH, LVEF 50-55, severe LAE, nl RV, MV annuloplasty ring with trace MR, trace AI  ?? Echo 02/2018 - TDS, mod LVH, cannot determine LVEF, nl RV, severe LAE, MV annuloplasty ring, trace AI  ?? Echo 03/2018 - Definity used, LVEF 50-55  ??     ASSESSMENT AND PLAN:    1. HTN - Blood pressure is well controlled. Continue current antihypertensive regimen.   2.??Dyslipidemia - Recent lipids unknown.??Continue current medications??and follow up labs.   3. Hx endocarditis and subsequent MV rupture with severe MR - S/p MV repair 11/16/15. Clinically stable in this regard.??Plan on serial echo and dental endocarditis ppx going forward.  4. Atrial fibrillation - Paroxysmal with infrequent but very symptomatic recurrences. Currently/primarily in sinus rhythm. CHADS-VASc 2. Continue xarelto 20. EP referral to explore rhythm control strategies.  5.??OSA - Pulm followup. Uses CPAP sporadically.  6. Obesity - Bariatric surg referral given.    [name], MD

## 2021-01-04 ENCOUNTER — Ambulatory Visit: Admit: 2021-01-04 | Payer: PRIVATE HEALTH INSURANCE | Primary: Internal Medicine

## 2021-01-04 ENCOUNTER — Encounter: Admit: 2021-01-04 | Payer: PRIVATE HEALTH INSURANCE | Attending: Cardiovascular Disease | Primary: Internal Medicine

## 2021-01-04 ENCOUNTER — Ambulatory Visit

## 2021-01-04 DIAGNOSIS — I480 Paroxysmal atrial fibrillation: Secondary | ICD-10-CM

## 2021-01-04 LAB — TRANSTHORACIC ECHOCARDIOGRAM (TTE) COMPLETE (CONTRAST/BUBBLE/3D PRN)
AR Max Velocity PISA: 429.36 cm/s
AR PHT: 500.3 cm
AV Area by Peak Velocity: 2.3 cm2
AV Peak Gradient: 13 mmHg
AV Peak Gradient: 73.739 mmHg
AV Peak Velocity: 1.8 m/s
AV Velocity Ratio: 0.72
AV Velocity Ratio: 0.74
AVA/BSA Peak Velocity: 1 cm2/m2
Ao Root Index: 1.51 cm/m2
Aortic Root: 3.4 cm
Ascending Aorta Index: 1.11 cm/m2
Ascending Aorta: 2.5 cm
EF BP: 53 % — AB (ref 55–100)
Fractional Shortening 2D: 27 % (ref 28–44)
Fractional Shortening 2D: 27.1665 %
IVC Sniffing: 1.7 cm
IVSd: 1.3 cm — AB (ref 0.6–1)
LA Area 2C: 28.7 cm2
LA Area 4C: 28.4 cm2
LA Major Axis: 5.5 cm
LA Volume 2C: 96 mL — AB (ref 18–58)
LA Volume 4C: 101 mL — AB (ref 18–58)
LA Volume Index 2C: 43 mL/m2 — AB (ref 16–34)
LA Volume Index 4C: 45 mL/m2 — AB (ref 16–34)
LV EDV A2C: 147 mL
LV EDV A4C: 160 mL
LV EDV BP: 157 mL — AB (ref 67–155)
LV EDV Index A2C: 65 mL/m2
LV EDV Index A4C: 71 mL/m2
LV EDV Index BP: 70 mL/m2
LV EDV Index Teich: 0 mL/m2
LV EDV Teich: 1 mL
LV ESV A2C: 34.6274 mL
LV ESV A2C: 66 mL
LV ESV A4C: 33.9953 mL
LV ESV A4C: 81 mL
LV ESV BP: 74 mL — AB (ref 22–58)
LV ESV Index A2C: 29 mL/m2
LV ESV Index A4C: 36 mL/m2
LV ESV Index BP: 33 mL/m2
LV ESV Index Teich: 0 mL/m2
LV ESV Teich: 0 mL
LV ESV Teich: 32.8564 mL
LV Ejection Fraction A2C: 55 %
LV Ejection Fraction A4C: 50 %
LV Mass 2D Index: 142.6 g/m2 — AB (ref 49–115)
LV Mass 2D: 320.9 g — AB (ref 88–224)
LV RWT Ratio: 0.51
LVIDd Index: 2.44 cm/m2
LVIDd: 5.5 cm (ref 4.2–5.9)
LVIDs Index: 1.78 cm/m2
LVIDs: 4 cm
LVOT Area: 3.1 cm2
LVOT Diameter: 2 cm
LVOT Mean Gradient: 3.8447 mmHg
LVOT Peak Gradient: 7 mmHg
LVOT Peak Velocity: 1.3 m/s
LVOT SV: 72.2 ml
LVOT Stroke Volume Index: 32.1 mL/m2
LVOT VTI: 23 cm
LVPWd: 1.4 cm — AB (ref 0.6–1)
Left Ventricular Ejection Fraction: 53
Left Ventricular Outflow Tract Mean Velocity: 0.8994 cm/s
Left Ventricular Stroke Volume by 2-D Biplane-MOD: 35.3013 mL
MV A Velocity: 1.63 m/s
MV Area by PHT: 1.6 cm2
MV Area by VTI: 0.9 cm2
MV E Velocity: 1.63 m/s
MV E Wave Deceleration Time: 483.4 ms
MV E/A: 1
MV Max Velocity: 2.1 m/s
MV Mean Gradient: 8 mmHg
MV Mean Inflow Velocity: 1.5 cm/s
MV PHT: 140.2 ms
MV Peak Gradient: 17 mmHg
MV VTI: 80.2 cm
MV:LVOT VTI Index: 3.49
Mitral Valve E-F Slope by M-mode: 3.3659
TR Max Velocity: 2.87 m/s
TR Peak Gradient: 33 mmHg

## 2021-01-04 LAB — ECHO ADULT COMPLETE
AR Max Vel: 429.36 cm/s
AR PHT: 500.3 cm
AV Area by Peak Velocity: 2.3 cm2
AV Peak Gradient: 13 mmHg
AV Peak Velocity: 1.8 m/s
AV Velocity Ratio: 0.72
AV Velocity Ratio: 0.74
AV peak gradient: 73.739 mmHg
AVA/BSA Peak Velocity: 1 cm2/m2
Ao Root Index: 1.51 cm/m2
Aortic Root: 3.4 cm
Ascending Aorta Index: 1.11 cm/m2
Ascending Aorta: 2.5 cm
EF BP: 53 % — AB (ref 55–100)
Fractional Shortening 2D: 27 % (ref 28–44)
IVC Sniffing: 1.7 cm
IVSd: 1.3 cm — AB (ref 0.6–1.0)
LA Area 2C: 28.7 cm2
LA Area 4C: 28.4 cm2
LA Major Axis: 5.5 cm
LA Volume 2C: 96 mL — AB (ref 18–58)
LA Volume 4C: 101 mL — AB (ref 18–58)
LA Volume Index 2C: 43 mL/m2 — AB (ref 16–34)
LA Volume Index 4C: 45 mL/m2 — AB (ref 16–34)
LV EDV A2C: 147 mL
LV EDV A4C: 160 mL
LV EDV BP: 157 mL — AB (ref 67–155)
LV EDV Index A2C: 65 mL/m2
LV EDV Index A4C: 71 mL/m2
LV EDV Index BP: 70 mL/m2
LV EDV Index Teich: 0 mL/m2
LV EDV Teich: 1 mL
LV ESV A2C: 66 mL
LV ESV A4C: 81 mL
LV ESV BP: 74 mL — AB (ref 22–58)
LV ESV Index A2C: 29 mL/m2
LV ESV Index A4C: 36 mL/m2
LV ESV Index BP: 33 mL/m2
LV ESV Index Teich: 0 mL/m2
LV ESV Teich: 0 mL
LV Ejection Fraction A2C: 55 %
LV Ejection Fraction A4C: 50 %
LV Mass 2D Index: 142.6 g/m2 — AB (ref 49–115)
LV Mass 2D: 320.9 g — AB (ref 88–224)
LV RWT Ratio: 0.51
LVIDd Index: 2.44 cm/m2
LVIDd: 5.5 cm (ref 4.2–5.9)
LVIDs Index: 1.78 cm/m2
LVIDs: 4 cm
LVOT Area: 3.1 cm2
LVOT Diameter: 2 cm
LVOT Peak Gradient: 7 mmHg
LVOT Peak Velocity: 1.3 m/s
LVOT SV: 72.2 ml
LVOT Stroke Volume Index: 32.1 mL/m2
LVOT VTI: 23 cm
LVPWd: 1.4 cm — AB (ref 0.6–1.0)
Left Ventricular Fractional Shortening by 2D: 27.1665 %
Left Ventricular Outflow Tract Mean Gradient: 3.8447 mmHg
Left Ventricular Outflow Tract Mean Velocity: 0.8994 cm/s
Left Ventricular Stroke Volume by 2-D Biplane-MOD: 35.3013 mL
Left Ventricular Stroke Volume by 2-D Single Plane- MOD: 33.9953 mL
Left Ventricular Stroke Volume by 2-D Single Plane- MOD: 34.6274 mL
Left Ventricular Stroke Volume by Teichholz Method: 32.8564 mL
MV A Velocity: 1.63 m/s
MV Area by PHT: 1.6 cm2
MV Area by VTI: 0.9 cm2
MV E Velocity: 1.63 m/s
MV E Wave Deceleration Time: 483.4 ms
MV E/A: 1
MV Max Velocity: 2.1 m/s
MV Mean Gradient: 8 mmHg
MV Mean Inflow Velocity: 1.5 cm/s
MV PHT: 140.2 ms
MV Peak Gradient: 17 mmHg
MV VTI: 80.2 cm
MV:LVOT VTI Index: 3.49
Mitral Valve Deceleration Slope: 3.3659
TR Max Velocity: 2.87 m/s
TR Peak Gradient: 33 mmHg

## 2021-01-04 MED ORDER — PERFLUTREN LIPID MICROSPHERES 1.1 MG/ML IV
1.1 mg/mL | Freq: Once | INTRAVENOUS | Status: AC
Start: 2021-01-04 — End: 2021-01-05

## 2021-01-08 ENCOUNTER — Ambulatory Visit
Admit: 2021-01-08 | Discharge: 2021-01-08 | Payer: PRIVATE HEALTH INSURANCE | Attending: Medical Oncology | Primary: Internal Medicine

## 2021-01-08 ENCOUNTER — Telehealth

## 2021-01-08 ENCOUNTER — Ambulatory Visit

## 2021-01-08 DIAGNOSIS — C3412 Malignant neoplasm of upper lobe, left bronchus or lung: Secondary | ICD-10-CM

## 2021-01-08 NOTE — Telephone Encounter (Signed)
Called pt and advised that echo was within acceptable range.

## 2021-01-08 NOTE — Progress Notes (Signed)
Progress Notes by Neysa Hotter, MD at 01/08/21 1300                Author: Neysa Hotter, MD  Service: --  Author Type: Physician       Filed: 01/08/21 1350  Encounter Date: 01/08/2021  Status: Signed          Editor: Neysa Hotter, MD (Physician)               Medical Oncology Progress Note      Bobby Guzman   [id number]      [id number]   13-Oct-1961      01/08/2021      This is a telemedicine video visit during the COVID-19 pandemic.   The patient has given consent to a virtual visit and understands:    ? The patient has the right to refuse a virtual visit and have a face-to-face visit.    ? The patient has the right to know the professional credentials of the person who will be speaking to them.    ? The patient has the right to know the location of the provider and what technology they will be using.    ? The patient has the right to have appropriate staff available to them immediately while receiving telehealth services for emergencies or immediate needs.    ? The patient has the right to know the identities of all persons on the call.    ? The patient has the right to ask for a different provider and that, if they request a different provider, there may be a delay or a face-to-face visit may be needed.    ? Telehealth calls cannot be recorded without the patient's consent.    ? Translation services must be provided if needed.    ? The patient must be informed that coinsurances and deductibles will be applied by Medicare and they are responsible for these balances.      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       History of Present Illness:  The patient was seen as a virtual video visit and consent obtained.       Bobby Guzman was evaluated while he was at home, while the provider was at work, utilizing a laptop and a Agricultural engineer.       Others participating in the visit were none        Reviewed: The patient was last seen in follow-up in December 2019.  He states that he was unable to travel to the [place] from [place] because of the COVID-19 pandemic.  He believes he may have had a COVID-19 infection right at the outset sometime  in February or March 2020 because he had symptoms of fevers associated with a cough.  He believes he was infected while visiting [place].  He is now back in the [place] and has received the first dose of the [place] COVID-19 vaccine on 12/28/2020.  He was seen  in follow-up recently by his cardiologist and I have reviewed the notes.  He wants to have bariatric surgery.  He also describes urinary symptoms and wants to have a prostate check.  I will refer him to urology.  He denies any hemoptysis.  He wants to  have a staging CAT scan of the chest abdomen and pelvis which will be ordered.  He was advised follow-up with his PCP will be having lab work.  His medications were reviewed.      SUBJECTIVE:   The patient has some symptoms of shortness of breath.  He continues on his various medications as listed.  He is on Xarelto.  He denies any  bleeding from any site.  He denies any hemoptysis.   He has significant obesity and wants to pursue bariatric surgery.      As noted on 12/08/2018:    "The patient was last seen by me on 03/10/2018 and prior to that on 04/15/2017, March 14, 2016 and October 17, 2015.  The patient did have a CAT scan of the chest abdomen pelvis on 03/17/2018 which was negative for any evidence of recurrence of his lung cancer.   Overall the patient feels well.  He had recent lab work earlier this month with his endocrinologist which was reviewed.  He continues on his various medications as noted.  He denies any hemoptysis.  He has some dyspnea on exertion which is chronic.   He is working hard to try and lose weight and has actually lost weight since his most recent visit in March 2019.  He tells me he is leaving for [place] over the next several days and may not be back for a year.  I have recommended that he have follow-up  CAT scans in [place] if possible in April 2020."      As noted on 03/10/2018:    "..The patient had informed me that during his previous visit that he had an MRI scan of his chest done in [place] on November 08, 2016.  The patient had translated the report for me and there was no evidence of recurrence of his lung cancer.      The patient has been in [place] and underwent endoscopy for fullness and discomfort of his upper abdomen with symptoms of gastroesophageal reflux.  From what I can gather he was told that he may have a hiatal hernia.  He was prescribed pantoprazole. He  was seen by Dr. Ida Rogue last week due to increasing SOB and cough +phlegm. He denies hemoptysis.   He has not lost any weight. He reports he does not exercise and has not cut down caloric intakes.  He understands that he needs to lose weight.      The patient denies any symptoms of cough or hemoptysis.  After discussion it was decided that he will have a CAT scan of the chest abdomen and pelvis for surveillance of his lung cancer.  I have also  recommended that he make an appointment to see the  gastroenterologist.  He plans to travel to [place] in April and will return to the [place]  in December 2019."         As noted 04/15/2017:   "His major problem relates to obesity.  He has reviewed this with his endocrinologist Dr. Sherril Croon who has suggested bariatric surgery.  The patient met with the bariatric surgeon and is very reluctant to pursue bariatric surgery.  He describes dyspnea  on exertion.  The patient was a heavy smoker in the past and I have recommended that he see a pulmonary specialist.  The patient was advised to pursue a program of caloric restriction to achieve weight loss.  He also had questions about taking testosterone  injections as recommended by a physician in [place].  I have advised him to review this with his endocrinologist."      As noted on 03/14/2016:   "He informed me today that she had mitral valve heart surgery at [facility] on 11/16/2015 with Dr. Andree Elk.  His MediPort was removed on 11/15/2016.  He is feeling a lot better since his surgery.  She denies any shortness of breath.  He denies any  chest pain.  He denies any hemoptysis.  He is trying to lose weight.  He states that his ulcerative colitis acts up from time to time.  He denies any bright red blood per rectum.  He denies any fevers or chills.  The patient states that he had a CAT scan  of the chest and an MRI at [facility] prior to his surgery on November 16, 2015."      As noted on 10/17/2015:   "The patient had CT scan on 03/23/15 as advised. He has not had the mediport removed as advised. He states he was in [place] and was worked up for a GI Bleed, possible ulcer and had 18 polyps removed during a colonoscopy. He states he was told that the polyps  were "almost cancer". I have requested a translated copy of the records. He states he had lost weigh as advised but then gained it back in [place] while he was away on Business. He plans to follow up with his Cardiac  Surgeons in [facility] and will discuss removal  of the mediport. He has some dyspnea on exertion and an occasional cough. He denies any hemoptysis."      As noted on 03/17/15:   "The patient went for routine checkup and was found to have an abnormality in his left lung on chest x-ray.  This was followed by a CAT scan and further workup leading to surgical resection in [place].  The original pathology report is in  [place].  I  have reviewed the translation and the patient had a moderately to well-differentiated squamous cell carcinoma in the left upper lobe excision specimen.  39 lymph nodes were negative for any evidence of metastatic spread: Stage IIB (pT3,pN0,M0).  The patient  was not recommended any further adjuvant treatment in [place] but was seen in Medical Oncology consultation in [place] and was advised to have adjuvant chemotherapy.  He was seen in [place] by Dr. Charlott Holler who also recommended adjuvant chemotherapy  with Navelbine and Cisplatin as per NCCN guidelines.  This was started on now 09/29/2010 and according to Dr. Ansel Bong notes he had an elevated pretreatment CA-125 at 25 which eventually decreased to normal.  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  He had an inflamed anal fissure.  The patient travels back and forth between the [place] and [place] on business.  He still has a MediPort  which he states he gets flushed once a year.  He has been recommended to have the MediPort removed but informed me that he was waiting for 5 years to be up before he would remove the MediPort.  He denies any fevers or chills.  The patient has surveillance  scans every 6 months and is due for a CAT scan of the chest abdomen and pelvis which was last done in [place] in September 2015 and in [place] in January 2015.      The patient denies any allergies.  He smoked at least 1-1/2-2 packs per day for  several years and quit smoking 5 years ago.  He has a history of diabetes mellitus and is on insulin.  He has a history of hypertension and takes Atacand.  He developed mitral  valve regurgitation following the episode of endocarditis and has symptoms of dyspnea on exertion.  He is overweight since he stopped smoking and is trying to lose weight.  He has seen Dr. Ida Rogue  from Cardiology and has been recommended to have mitral  valve surgery which he plans to have this Fall.  He has a history of ulcerative colitis which has not been a problem since he had chemotherapy.  He has a history of sleep apnea and is known to have elevated lipids.  The patient was born in [place] and  has lived in the [place] for the past 25 years.  He states that one half-sister died of some kind of cancer but he is unsure as as to the details.  He has 2 sons who are in good health.      His medications were reviewed."      DX      Encounter Diagnoses        Name  Primary?         ?  Primary malignant neoplasm of left upper lobe of lung ([place])  Yes         ?  Obesity, morbid [facility])                Past Medical History:        Diagnosis  Date         ?  Diabetes ([place])       ?  Endocarditis  2012          dx [facility]; mitral valve; MV repair (Dr. Elio Forget [facility]) 11/16/15         ?  Essential hypertension       ?  Hyperlipidemia       ?  Lung cancer ([place])  2011          s/p LUL resection and chemo         ?  Primary malignant neoplasm of left upper lobe of lung ([place])       ?  Sleep apnea           ?  Ulcerative colitis [facility])             Past Surgical History:         Procedure  Laterality  Date          ?  HX HEART CATHETERIZATION         ?  HX MITRAL VALVULOPLASTY    11/16/2015          Mitral REPAIR and myomectomy with Dr. Andree Elk, [facility]          ?  HX OTHER SURGICAL    2011          LUL resection          ?  PR CHEST SURGERY PROCEDURE UNLISTED              left upper lobectomy           Current Outpatient Medications         Medication  Sig         ?  Farxiga 10 mg tab tablet       ?  FreeStyle Libre 14 Day Sensor kit       ?  semaglutide (OZEMPIC SC)  by SubCUTAneous route.     ?  rivaroxaban (Xarelto) 20 mg tab tablet  TAKE 1 TAB BY MOUTH DAILY (WITH DINNER).     ?  candesartan (ATACAND) 4 mg tablet  Take 0.5 Tablets by mouth daily.     ?  rosuvastatin (CRESTOR) 20 mg tablet  Take 1 Tablet by mouth nightly.     ?  omega-3 acid ethyl esters (LOVAZA) 1 gram capsule  Take 2 g by mouth two (2) times a day.     ?  cyanocobalamin (VITAMIN B-12) 100 mcg tablet  Take 100 mcg by mouth daily.     ?  ergocalciferol (VITAMIN D2) 50,000 unit capsule  Take 50,000 Units by mouth.     ?  FREESTYLE LITE STRIPS strip  USE TO TEST 4 TIMES A DAY AS DIRECTED E11.65         ?  sub-q insulin device, 40 unit devi  by Does Not Apply route.          No current facility-administered medications for this visit.         No Known Allergies      Social History          Tobacco Use         ?  Smoking status:  Former Smoker              Packs/day:  2.00         Years:  34.00         Pack years:  68.00         Types:  Cigarettes         Quit date:  02/20/2011         Years since quitting:  9.8         ?  Smokeless tobacco:  Never Used        ?  Tobacco comment: e- cig no nicotine       Substance Use Topics         ?  Alcohol use:  Yes              Alcohol/week:  0.0 standard drinks             Comment: 2shots or beers /week           Family History         Problem  Relation  Age of Onset          ?  Stroke  Mother       ?  Heart Attack  Father       ?  High Cholesterol  Sister       ?  Hypertension  Sister       ?  High Cholesterol  Brother            ?  Hypertension  Brother              Review of Systems:   A detailed 10 organ review of systems is obtained with pertinent positives as listed in the History of Present Illness and Past Medical History. All others are negative.      Physical Exam:   There were no vitals taken for this visit.         No vital signs  recorded today. This is a virtual video Telemed visit during the Kevin 19 Pandemic the patient was awake, alert and oriented in no acute distress.       VIRTUAL EXAMINATION:   ECOG PS 0   General: Well developed, well nourished, Caucasian male, awake and alert in no acute distress..    Skin: Normal coloration.   HEENT: No cough or runny nose. Eyes appear normal.   Neuro: Patient was awake, alert and Oriented x 3. Mood and affect are normal.    Lungs: Breathing not labored.      As noted on 12/08/2018:      General appearance   WD, WN, be Caucasian male, alert, cooperative, no distress, appears stated age     Head   Normocephalic, without obvious abnormality, atraumatic     Eyes   PERRLA     Nose  Nares normal. Septum midline. Mucosa normal. No drainage or sinus tenderness.        Throat  Lips, mucosa, and tongue normal. Teeth and gums normal.  Oropharynx clear.        Neck  supple, symmetrical, trachea midline, no adenopathy, thyroid: not enlarged, symmetric, no tenderness/mass/nodules,no JVD     Back    No midline or CVA tenderness     Lungs    clear to auscultation bilaterally     Heart   regular rate and rhythm, S1, S2 normal, note IV/VI holosystolic at apex      Abdomen    soft, obese, non-tender, non-distended. Bowel sounds normal. No masses,  No organomegaly     Extremities  extremities normal, atraumatic, no cyanosis or edema        Skin  Skin color, texture, turgor normal. No rashes or lesions        Lymph nodes  Nonpalpable     Neurologic  Nonfocal           Labs:    No results found for this or any previous visit (from the past 24 hour(s)).            Impression and Plan:   The patient appears to be clinically in remission with regards to his history of squamous cell carcinoma of the lung.       I recommended that he have a repeat staging work-up with a CAT scan of the chest abdomen and pelvis.  He states that he last had a chest scan in [place] in early 2020.       He was advised follow-up with his PCP.   He plans to have bariatric surgery and was recently seen in follow-up by his cardiologist.  He was also seen in follow-up recently by endocrinology.      He describes frequency of nocturia and urinary frequency.  I have given him contact information from Percival Spanish, MD from urology.                    ICD-10-CM  ICD-9-CM  1.  Primary malignant neoplasm of left upper lobe of lung (HCC)   C34.12  162.3             2.  Obesity, morbid ([address])   E66.01  278.01            Follow-up and Dispositions      ??  Return in about 6 months (around 07/08/2021).             Neysa Hotter, MD, FACP   Director, [place] Program   Chief, Sub-Section of Medical Oncology,   [facility], [place] [address]          [facility]   Member of the [place], [place]  [address]   P: [place]   F: [phone]           01/08/2021      The billing code submitted in association with this evaluation also includes the time to review patient's prior records in the [facility] system, communicate  with the physician team and nursing, and to obtain corroborating data and discuss the risk and benefits of the proposed management plan with the patient. > 35  minutes for the aforementioned with > 50% of the time spent in direct face to face counseling  and discussion of the issues as noted above.

## 2021-01-08 NOTE — Telephone Encounter (Signed)
Called patient to schedule 6 month follow up appointment. Patient said he won't be back in 6 months. He will have his son call at the end of the year to arrange an appointment.

## 2021-01-10 ENCOUNTER — Ambulatory Visit
Admit: 2021-01-10 | Discharge: 2021-01-10 | Payer: PRIVATE HEALTH INSURANCE | Attending: Clinical Cardiac Electrophysiology | Primary: Internal Medicine

## 2021-01-10 ENCOUNTER — Ambulatory Visit: Attending: Clinical Cardiac Electrophysiology

## 2021-01-10 DIAGNOSIS — I480 Paroxysmal atrial fibrillation: Secondary | ICD-10-CM

## 2021-01-10 NOTE — Progress Notes (Signed)
Progress Notes by Garald Braver, MD at 01/10/21 1130                Author: Garald Braver, MD  Service: --  Author Type: Physician       Filed: 01/11/21 1657  Encounter Date: 01/10/2021  Status: Signed          Editor: Garald Braver, MD (Physician)                         Department of Electrophysiology   [facility]    [address]   [place] [address]   Office: [phone]     Office Fax: [phone]        Cardiac Electrophysiology          Patient: Bobby Guzman  Date of Birth: 1961-11-26     MRN: [id number]           Today's Date: 01/10/2021  Age: 60 y.o.    Gender: male        Referring Physician: A. Najovits MD         History of Present Illness:   60 y.o. male with a PMH of HTN,  Lung CA s/p resection and chemo in 2011, endocarditis s/p MV repair in 2016, UC, DM and PAF.  The patient states that two years ago he had an episode of AF and went to an ER where it self terminated.  In December he was in [place] and had an episode of  palpitations again and went to the ER in [place] where it again self terminated.  Patient was discharged and told to take amiodarone for 3 months.  Patient comes to the office to discuss treatment options.  Patient other than these episodes says he feels  well with only rare palpitations.                    Past Medical History:        Diagnosis  Date         ?  Diabetes ([place])       ?  Endocarditis  2012          dx [facility]; mitral valve; MV repair (Dr. Elio Forget [facility]) 11/16/15         ?  Essential hypertension       ?  Hyperlipidemia       ?  Lung cancer ([place])  2011          s/p LUL resection and chemo         ?  Primary malignant neoplasm of left upper lobe of lung ([place])       ?  Sleep apnea           ?  Ulcerative colitis [facility])                Past Surgical History:         Procedure  Laterality  Date          ?  HX HEART CATHETERIZATION         ?  HX MITRAL VALVULOPLASTY    11/16/2015          Mitral REPAIR  and myomectomy with Dr. Andree Elk, [facility]          ?  HX OTHER SURGICAL    2011          LUL resection          ?  PR CHEST SURGERY PROCEDURE UNLISTED              left upper lobectomy           Medications:      Current Outpatient Medications:    ?  Farxiga 10 mg tab tablet, , Disp: , Rfl:    ?  FreeStyle Libre 14 Day Sensor kit, , Disp: , Rfl:    ?  semaglutide (OZEMPIC SC), by SubCUTAneous route., Disp: , Rfl:    ?  rivaroxaban (Xarelto) 20 mg tab tablet, TAKE 1 TAB BY MOUTH DAILY (WITH DINNER)., Disp: 90 Tablet, Rfl: 3   ?  candesartan (ATACAND) 4 mg tablet, Take 0.5 Tablets by mouth daily., Disp: 45 Tablet, Rfl: 3   ?  rosuvastatin (CRESTOR) 20 mg tablet, Take 1 Tablet by mouth nightly., Disp: 90 Tablet, Rfl: 3   ?  omega-3 acid ethyl esters (LOVAZA) 1 gram capsule, Take 2 g by mouth two (2) times a day., Disp: , Rfl:    ?  cyanocobalamin (VITAMIN B-12) 100 mcg tablet, Take 100 mcg by mouth daily., Disp: , Rfl:    ?  ergocalciferol (VITAMIN D2) 50,000 unit capsule, Take 50,000 Units by mouth., Disp: , Rfl:    ?  FREESTYLE LITE STRIPS strip, USE TO TEST 4 TIMES A DAY AS DIRECTED E11.65, Disp: , Rfl: 3   ?  sub-q insulin device, 40 unit devi, by Does Not Apply route., Disp: , Rfl:        Allergies: No Known Allergies      Social History: The patient reports that he quit smoking about 9 years ago. His smoking use included cigarettes. He has a 68.00 pack-year smoking  history. He has never used smokeless tobacco. He reports current alcohol use. He reports that he does not use drugs.      Family History: family history includes Heart Attack in his father; High Cholesterol  in his brother and sister; Hypertension in his brother and sister; Stroke in his mother.      Review of Systems: Pertinent positives and negatives as per HPI. Denies fever, chills, sweat, weight change, hemoptysis, cough, abdominal pain, N/V, diarrhea, hematuria, bleeding, easy bruising. All  other systems reviewed and are negative.      Physical  Exam:   Blood pressure 125/82, pulse 65, SpO2 97 %.    Physical Exam   Constitutional :        Comments: WD obese male NAD   HENT:       Head: Normocephalic and atraumatic.   Cardiovascular :       Rate and Rhythm: Normal rate and regular rhythm.      Heart sounds: Normal heart sounds.    Pulmonary:       Effort: Pulmonary effort is normal.      Breath sounds: Normal breath sounds.   Abdominal :      Palpations: Abdomen is soft.      Tenderness: There is no abdominal tenderness.     Musculoskeletal:      Right lower leg: No edema.      Left lower leg: No edema.     Neurological:       Mental Status: He is oriented to person, place, and time.   Psychiatric :         Behavior: Behavior normal.              Labs/Testing:      ECG: NSR rate 68 LBBB  XR Results (most recent):   No results found for this or any previous visit.       No results found for this or any previous visit (from the past 24 hour(s)).   01/04/21      ECHO ADULT COMPLETE 01/04/2021 01/04/2021      Interpretation Summary   ?  Left??Ventricle: Left ventricle is mildly dilated. Increased wall thickness. Findings consistent with mild concentric hypertrophy. See diagram for wall motion  findings. Low normal left ventricular systolic function with a visually estimated EF of 50 - 55%. Diastolic dysfunction present with normal LV EF.   ?  Aortic??Valve: Mildly calcified cusps.   ?  Mitral??Valve: Valve repaired by annular ring. Mild to moderate stenosis. MV mean gradient is 8 mmHg.   ?  Tricuspid??Valve: Mildly elevated RVSP.   ?  Left??Atrium: Left atrium is severely dilated.   ?  Contrast used: Definity.   ?  Overall findings not significantly changed from prior echo in 2019.      Signed by: [name], MD on 01/04/2021  3:09 PM                Assessment/Plan:   60 year old obese male with multiple medical issues who had episodes of PAF two years ago and again in December in [place] and was placed on amiodarone.   Presently patient is feeling well on  amiodarone 200 mg daily.   I have asked the patient to discontinue his amiodarone as he has very rare episodes of AF.  He will return in a month an will place a 3 week monitor to evaluate for asymptomatic AF.   We discussed options of AF ablation vs medical therapy but as his AF is extremely infrequent we need more data before we can make any recommendation.  I discussed the need for weight reduction and explained that weight loss alone can alter the course  of AF progression.      RTC 1 month            Garald Braver, MD

## 2021-01-19 ENCOUNTER — Inpatient Hospital Stay: Admit: 2021-01-19 | Payer: PRIVATE HEALTH INSURANCE | Attending: Medical Oncology | Primary: Internal Medicine

## 2021-01-19 DIAGNOSIS — J9811 Atelectasis: Secondary | ICD-10-CM

## 2021-01-19 LAB — CREATININE AND GFR
Creatinine: 1.33 mg/dL — ABNORMAL HIGH (ref 0.70–1.30)
Creatinine: 1.33 mg/dL — ABNORMAL HIGH (ref 0.70–1.30)
EGFR IF NonAfrican American: 58 mL/min/{1.73_m2} — ABNORMAL LOW (ref >60)
GFR African American: >60 mL/min/{1.73_m2} (ref >60)
GFR est AA: >60 mL/min/{1.73_m2} (ref >60)
GFR est non-AA: 58 mL/min/{1.73_m2} — ABNORMAL LOW (ref >60)

## 2021-01-19 LAB — BUN
BUN: 24 mg/dL — ABNORMAL HIGH (ref 7–18)
BUN: 24 mg/dL — ABNORMAL HIGH (ref 7–18)

## 2021-01-19 MED ORDER — IOPAMIDOL 61 % IV SOLN
300 mg iodine /mL (61 %) | Freq: Once | INTRAVENOUS | Status: AC
Start: 2021-01-19 — End: 2021-01-19
  Administered 2021-01-19: 19:00:00 via INTRAVENOUS

## 2021-01-19 MED ORDER — BARIUM SULFATE 2 % ORAL SUSP
2.1 % (w/v), 2.0 % (w/w) | Freq: Once | ORAL | Status: AC
Start: 2021-01-19 — End: 2021-01-19
  Administered 2021-01-19: 19:00:00 via ORAL

## 2021-01-19 MED ORDER — IOPAMIDOL 61 % IV SOLN
61 % | Freq: Once | INTRAVENOUS | Status: AC
Start: 2021-01-19 — End: 2021-01-19
  Administered 2021-01-19: 19:00:00 via INTRAVENOUS

## 2021-01-19 MED FILL — ISOVUE-300  61 % INTRAVENOUS SOLUTION: 300 mg iodine /mL (61 %) | INTRAVENOUS | Qty: 100

## 2021-01-19 MED FILL — BARIUM SULFATE 2 % ORAL SUSP: 2.1 % (w/v), 2.0 % (w/w) | ORAL | Qty: 900

## 2021-01-19 MED FILL — ISOVUE-300  61 % INTRAVENOUS SOLUTION: 300 mg iodine /mL (61 %) | INTRAVENOUS | Qty: 50

## 2021-01-23 ENCOUNTER — Ambulatory Visit
Admit: 2021-01-23 | Discharge: 2021-01-23 | Payer: PRIVATE HEALTH INSURANCE | Attending: Urology | Primary: Internal Medicine

## 2021-01-23 ENCOUNTER — Ambulatory Visit: Attending: Urology

## 2021-01-23 DIAGNOSIS — R350 Frequency of micturition: Secondary | ICD-10-CM

## 2021-01-23 MED ORDER — NYSTATIN-TRIAMCINOLONE 100,000 UNIT/G-0.1 % TOPICAL CREAM
100000-0.1 unit/g-% | Freq: Two times a day (BID) | CUTANEOUS | 0 refills | Status: DC
Start: 2021-01-23 — End: 2021-10-22

## 2021-01-23 NOTE — Progress Notes (Signed)
Progress  Notes by Percival Spanish, MD at 01/23/21 1400                Author: Percival Spanish, MD  Service: --  Author Type: Physician       Filed: 01/23/21 1435  Encounter Date: 01/23/2021  Status: Signed          Editor: Percival Spanish, MD (Physician)                                  [facility]   [facility]   [address], [address]   [place] [address]   [place]   Office Fax: [phone]        Urology Consult          Patient: Bobby Guzman  MRN: [id number]   SSN: [id number]      Date of Birth: Jan 07, 1961    Age: 60 y.o.   Sex: male                   Date of Consultation:  January 23, 2021   Requesting Physician: [name], MD   Reason for Consultation:       Chief Complaint       Patient presents with        ?  New Patient        ?  (LUTS) Lower Urinary Tract Symptoms                     History of Present Illness:  Patient is a 60 y.o.  male , He presents for ischial visit.  Patient is here for lower urinary tract symptoms.   He complains of frequency and nocturia.  He does have a history of diabetes mellitus and is on [place].  He denies dysuria or hematuria.  His urinary stream is strong.  He is unsure if he has had a recent PSA level checked.      He also complains of balanitis.  He has been given cream in the past for this by urologist in [place].  Patient states he spends approximately 10 months of the year in [place].      Past Medical History:   No Known Allergies      Prior to Admission medications             Medication  Sig  Start Date  End Date  Taking?  Authorizing Provider            Ozempic 1 mg/dose (4 mg/3 mL) pnij    12/27/20    Yes  Provider, Historical     flash glucose sensor (FreeStyle Libre 2 Sensor) kit  [employer] 2 Sensor kit      Yes  Provider, Historical     nystatin-triamcinolone (MYCOLOG II) topical cream  Apply  to affected area two (2) times a day.  01/23/21    Yes  Percival Spanish, MD     Farxiga 10 mg tab tablet    12/25/20     Yes  Provider, Historical     FreeStyle Libre 14 Day Sensor kit    10/23/20    Yes  Provider, Historical     semaglutide (OZEMPIC SC)  by SubCUTAneous route.      Yes  Provider, Historical     rivaroxaban (Xarelto) 20 mg tab tablet  TAKE 1 TAB BY MOUTH DAILY (WITH  DINNER).  12/26/20    Yes  Najovits, Mitzi Hansen, MD     candesartan (ATACAND) 4 mg tablet  Take 0.5 Tablets by mouth daily.  12/26/20    Yes  Najovits, Mitzi Hansen, MD     rosuvastatin (CRESTOR) 20 mg tablet  Take 1 Tablet by mouth nightly.  12/26/20    Yes  Najovits, Mitzi Hansen, MD            omega-3 acid ethyl esters (LOVAZA) 1 gram capsule  Take 2 g by mouth two (2) times a day.      Yes  Provider, Historical            cyanocobalamin (VITAMIN B-12) 100 mcg tablet  Take 100 mcg by mouth daily.      Yes  Provider, Historical     ergocalciferol (VITAMIN D2) 50,000 unit capsule  Take 50,000 Units by mouth.      Yes  Provider, Historical     FREESTYLE LITE STRIPS strip  USE TO TEST 4 TIMES A DAY AS DIRECTED E11.65  04/07/17    Yes  Provider, Historical            sub-q insulin device, 40 unit devi  by Does Not Apply route.      Yes  Provider, Historical         PMHx:  has a past medical history of Coronary artery disease, Diabetes ([place]), Endocarditis  (2012), Essential hypertension, Hyperlipidemia, Kidney stones, Lung cancer ([place]) (2011), Primary malignant neoplasm of left upper lobe of lung ([place]), Sleep apnea, and Ulcerative colitis ([place]).    PSurgHx:  has a past surgical history that includes hx other surgical (2011); pr chest  surgery procedure unlisted; hx heart catheterization; and hx mitral valvuloplasty (11/16/2015).   PSocHx:  reports that he quit smoking about 9 years ago. His smoking use included cigarettes. He has a 68.00 pack-year smoking history.  He has never used smokeless tobacco. He reports current alcohol use. He reports that he does not use drugs.       ROS:     Review of Systems    Constitutional: Negative.     HENT: Negative.     Eyes: Negative.      Respiratory: Negative.     Cardiovascular: Negative.     Gastrointestinal: Negative.     Genitourinary: Negative.     Musculoskeletal: Negative.     Skin: Negative.     Neurological: Negative.     Endo/Heme/Allergies: Negative.     Psychiatric/Behavioral: Negative.              INTERNATIONAL PROSTATE SYMPTOM  SCORE (IPSS)      Over the past month, how often have you had the sensation of not emptying your bladder completely?0   Over the past month, how often have you had to urinate again less than 2 hours after you finished urinating?2   Over the past month, how often have you found you stopped and started again when you urinate?0   Over the past month, how often did you find it difficult to postpone urination?0   Over the past month, how often have you had a weak urinary stream?0   Over the past month, how often have you had to push or strain to begin urination?0   Over the past month, how many times did you most typically get up to urinate from the time you went to bed at night until the time you got up in the morning?3   Total Score:  5      Physical Exam:   Vital signs :    Visit Vitals      Ht  5' 8"  (1.727 m)     Wt  240 lb (108.9 kg)        BMI  36.49 kg/m??        General: Well nourished, well developed, Masculinized, non-toxic, NAD   HEENT: Normocephalic, atraumatic   Neck: Supple, trachea midline   Chest: Respirations unlabored   Breast: No gynecomastia    Abdomen: Soft, non-tender, no masses, no pulsations   Back: No CVAT   Genital:  Circumcised: no, Adult Male.  Slight erythema at the base of his glans.  No plaque or deformity. Meatus normal. Scrotum Normal, testes  descended, non-tender without masses, epididymities normal, spermatic cords normal.    DRE: Normal tone, non-tender, no masses   Prostate: 1+, smooth, non-tender, no masses, no fixations   Extremities: No Edema   Neuro: Oriented   Skin: No rash   Neurological: nonfocal        Lab Results         Component  Value  Date/Time            WBC  9.3   03/06/2018 07:36 AM       HCT  45.4  03/06/2018 07:36 AM       PLATELET  226  03/06/2018 07:36 AM       Sodium  136  03/06/2018 07:36 AM       Potassium  4.4  03/06/2018 07:36 AM       Chloride  99  03/06/2018 07:36 AM       CO2  27  03/06/2018 07:36 AM       BUN  24 (H)  01/19/2021 12:15 PM       Creatinine  1.33 (H)  01/19/2021 12:15 PM       Glucose  134 (H)  03/06/2018 07:36 AM       Calcium  9.2  03/06/2018 07:36 AM       Magnesium  1.9  11/30/2015 09:51 PM            INR  2.8 (HH)  01/12/2016 12:45 PM           UA: No results found for: COLOR, APPRN, SPGRU, REFSG, PHU, PROTU, GLUCU, KETU, BILU, UROU, NITU, LEUKU, GLUKE, EPSU, BACTU, WBCU, RBCU,  CASTS, UCRY      Cultures:      All Micro Results           None               Xrays: CT CHEST ABD PELV W CONT      Result Date: 01/19/2021   EXAM:  CT CHEST ABD PELV W CONT INDICATION: 60 year old male,  malignant left upper lobe neoplasm COMPARISON: March 17, 2018 CONTRAST:  150 mL of Isovue-300. TECHNIQUE: Following the uneventful intravenous administration of contrast, thin axial images  were obtained through the chest, abdomen and pelvis. Coronal and sagittal reconstructions were generated. Oral contrast was not administered. CT dose reduction was achieved through use of a standardized protocol tailored for this examination and automatic  exposure control for dose modulation. FINDINGS: THYROID: No nodule. MEDIASTINUM: Several tiny mediastinal nodes are seen. No hilar adenopathy is appreciated. HILA: No mass or lymphadenopathy. THORACIC AORTA: No dissection or aneurysm. MAIN PULMONARY ARTERY:  Normal in caliber. TRACHEA/BRONCHI: Patent. ESOPHAGUS: No wall thickening or dilatation. HEART: Normal in size. PLEURA:  No effusion or pneumothorax. LUNGS: The patient is post left upper lobe resection, a stable finding. Stable nodular scarring is seen  in the left lung. Subsegmental atelectasis is seen in dependent portions of the right lung. A small lipoma is seen laterally  along the right chest wall. LIVER: No mass or biliary dilatation. GALLBLADDER: Unremarkable. SPLEEN: No mass. PANCREAS: No mass  or ductal dilatation. ADRENALS: Unremarkable. KIDNEYS: No mass, calculus, or hydronephrosis. STOMACH: Unremarkable. SMALL BOWEL: No dilatation or wall thickening. COLON: No dilatation or wall thickening. Moderate burden of solid stool is seen. APPENDIX:  Unremarkable. PERITONEUM: No ascites or pneumoperitoneum. RETROPERITONEUM: No lymphadenopathy or aortic aneurysm. REPRODUCTIVE ORGANS: URINARY BLADDER: No mass or calculus. BONES: No destructive bone lesion. The patient is remotely post median sternotomy.  ADDITIONAL COMMENTS: N/A       1. Surgical changes seen in the left upper lung, with remote resection of the left upper lobe. 2. No pulmonary nodule. 3. Several small mediastinal nodes. None pathologically enlarged. 4. No adrenal or hepatic lesion. 5. No lytic or blastic bone lesion.      ECHO ADULT COMPLETE      Result Date: 01/04/2021   ?  Left??Ventricle: Left ventricle is mildly dilated. Increased wall thickness. Findings consistent with mild concentric hypertrophy. See diagram for wall motion findings. Low normal left ventricular systolic function with a visually estimated  EF of 50 - 55%. Diastolic dysfunction present with normal LV EF. ?  Aortic??Valve: Mildly calcified cusps. ?  Mitral??Valve: Valve repaired by annular ring. Mild to moderate stenosis. MV mean gradient is 8 mmHg. ?  Tricuspid??Valve:  Mildly elevated RVSP. ?  Left??Atrium: Left atrium is severely dilated. ?  Contrast used: Definity. ?  Overall findings not significantly changed from prior echo in 2019.               Assessment/Plan:        1. Frequency of urination   We discussed how his diabetes medication can be contributing to his urinary complaints.  He also complains of nocturia but admits to drinking a large amount of fluid in the evening.  He will have a PSA level checked.  If normal he will return in 1 year  for  repeat exam and PVR with a PSA prior.   - PSA, DIAGNOSTIC (PROSTATE SPECIFIC AG); Future      2. Balanitis   - nystatin-triamcinolone (MYCOLOG II) topical cream; Apply  to affected area two (2) times a day.  Dispense: 60 g; Refill: 0         Medications were reviewed and discussed, including all new medications prescribed at this visit.  Indications and side effects reviewed with the patient/family/caregiver with understanding verbalized.            Signed By:  Percival Spanish, MD  - January 23, 2021

## 2021-01-26 ENCOUNTER — Encounter: Attending: Cardiovascular Disease | Primary: Internal Medicine

## 2021-02-07 ENCOUNTER — Ambulatory Visit
Admit: 2021-02-07 | Discharge: 2021-02-07 | Payer: PRIVATE HEALTH INSURANCE | Attending: Clinical Cardiac Electrophysiology | Primary: Internal Medicine

## 2021-02-07 ENCOUNTER — Ambulatory Visit: Attending: Clinical Cardiac Electrophysiology

## 2021-02-07 DIAGNOSIS — I480 Paroxysmal atrial fibrillation: Secondary | ICD-10-CM

## 2021-02-07 NOTE — Progress Notes (Signed)
Progress Notes by Garald Braver, MD at 02/07/21 1130                Author: Garald Braver, MD  Service: --  Author Type: Physician       Filed: 02/07/21 1152  Encounter Date: 02/07/2021  Status: Signed          Editor: Garald Braver, MD (Physician)                         Department of Electrophysiology   [facility]    [address], [address]   [place] [address]   Office: [phone]     Office Fax: [phone]        Cardiac Electrophysiology          Patient: Bobby Guzman  Date of Birth: Jul 20, 1961     MRN: [id number]           Today's Date: 02/07/2021  Age: 60 y.o.    Gender: male        Referring Physician: A. Najovits MD         History of Present Illness:   60 y.o. male with a PMH of HTN, Lung CA s/p resection and chemo in 2011, endocarditis s/p MV repair in 2016, UC, DM and PAF.  The patient states that two years ago he had  an episode of AF and went to an ER where it self terminated.  In December he was in [place] and had an episode of palpitations again and went to the ER in [place] where it again self terminated.  Patient was discharged and told to take amiodarone for  3 months.   When I saw the patient one month ago I asked him to discontinue his amiodarone as he has very rare episodes of AF.  I also discussed the need for weight reduction and explained that weight loss alone can alter the course of AF progression.   The patient comes to the office for follow up.  He states he is feeling well with no palpitations, sob etc.  He has not gone on a diet and lost weight.                    Past Medical History:        Diagnosis  Date         ?  Coronary artery disease       ?  Diabetes ([place])       ?  Endocarditis  2012          dx [facility]; mitral valve; MV repair (Dr. Elio Forget [facility]) 11/16/15         ?  Essential hypertension       ?  Hyperlipidemia       ?  Kidney stones       ?  Lung cancer ([place])  2011          s/p LUL resection and chemo          ?  Primary malignant neoplasm of left upper lobe of lung ([place])       ?  Sleep apnea           ?  Ulcerative colitis [facility])                Past Surgical History:         Procedure  Laterality  Date          ?  HX HEART CATHETERIZATION         ?  HX MITRAL VALVULOPLASTY    11/16/2015          Mitral REPAIR and myomectomy with Dr. Andree Elk, [facility]          ?  HX OTHER SURGICAL    2011          LUL resection          ?  PR CHEST SURGERY PROCEDURE UNLISTED              left upper lobectomy           Medications:      Current Outpatient Medications:    ?  Ozempic 1 mg/dose (4 mg/3 mL) pnij, , Disp: , Rfl:    ?  flash glucose sensor (FreeStyle Libre 2 Sensor) kit, [employer] 2 Sensor kit, Disp: , Rfl:    ?  nystatin-triamcinolone (MYCOLOG II) topical cream, Apply  to affected area two (2) times a day., Disp: 60 g, Rfl: 0   ?  Farxiga 10 mg tab tablet, , Disp: , Rfl:    ?  FreeStyle Libre 14 Day Sensor kit, , Disp: , Rfl:    ?  semaglutide (OZEMPIC SC), by SubCUTAneous route., Disp: , Rfl:    ?  rivaroxaban (Xarelto) 20 mg tab tablet, TAKE 1 TAB BY MOUTH DAILY (WITH DINNER)., Disp: 90 Tablet, Rfl: 3   ?  candesartan (ATACAND) 4 mg tablet, Take 0.5 Tablets by mouth daily., Disp: 45 Tablet, Rfl: 3   ?  rosuvastatin (CRESTOR) 20 mg tablet, Take 1 Tablet by mouth nightly., Disp: 90 Tablet, Rfl: 3   ?  omega-3 acid ethyl esters (LOVAZA) 1 gram capsule, Take 2 g by mouth two (2) times a day., Disp: , Rfl:    ?  cyanocobalamin (VITAMIN B-12) 100 mcg tablet, Take 100 mcg by mouth daily., Disp: , Rfl:    ?  ergocalciferol (VITAMIN D2) 50,000 unit capsule, Take 50,000 Units by mouth., Disp: , Rfl:    ?  FREESTYLE LITE STRIPS strip, USE TO TEST 4 TIMES A DAY AS DIRECTED E11.65, Disp: , Rfl: 3   ?  sub-q insulin device, 40 unit devi, by Does Not Apply route., Disp: , Rfl:        Allergies: No Known Allergies      Social History: The patient reports that he quit smoking about 9 years ago. His smoking use included cigarettes. He  has a 68.00 pack-year smoking  history. He has never used smokeless tobacco. He reports current alcohol use. He reports that he does not use drugs.      Family History: family history includes Heart Attack in his father; High Cholesterol  in his brother and sister; Hypertension in his brother and sister; Joaquim Lai in his father; Stroke in his mother.      Review of Systems: Pertinent positives and negatives as per HPI. Denies fever, chills, sweat, weight change, hemoptysis, cough, abdominal pain, N/V, diarrhea, hematuria, bleeding, easy bruising. All  other systems reviewed and are negative.      Physical Exam:   Blood pressure 129/81, pulse 76, SpO2 94 %.    Physical Exam   Constitutional :        Appearance: Normal appearance. He is obese.   Cardiovascular:       Rate and Rhythm: Normal rate and regular rhythm.      Heart sounds: Normal heart sounds.    Pulmonary:  Effort: Pulmonary effort is normal.      Breath sounds: Normal breath sounds.   Abdominal :      Palpations: Abdomen is soft.      Tenderness: There is no abdominal tenderness.     Musculoskeletal:      Right lower leg: No edema.      Left lower leg: No edema.     Neurological:       Mental Status: He is alert and oriented to person, place, and time.    Psychiatric:         Behavior: Behavior normal.              Labs/Testing:      ECG: NSR LAD LBBB   XR Results (most recent):   No results found for this or any previous visit.       No results found for this or any previous visit (from the past 24 hour(s)).   01/04/21      ECHO ADULT COMPLETE 01/04/2021 01/04/2021      Interpretation Summary   ?  Left??Ventricle: Left ventricle is mildly dilated. Increased wall thickness. Findings consistent with mild concentric hypertrophy. See diagram for wall motion  findings. Low normal left ventricular systolic function with a visually estimated EF of 50 - 55%. Diastolic dysfunction present with normal LV EF.   ?  Aortic??Valve: Mildly calcified cusps.   ?   Mitral??Valve: Valve repaired by annular ring. Mild to moderate stenosis. MV mean gradient is 8 mmHg.   ?  Tricuspid??Valve: Mildly elevated RVSP.   ?  Left??Atrium: Left atrium is severely dilated.   ?  Contrast used: Definity.   ?  Overall findings not significantly changed from prior echo in 2019.      Signed by: [name], MD on 01/04/2021  3:09 PM                Assessment/Plan:   Will place a 3 week event recorder to determine if he has asymptomatic AF.   Will contact patient to review data when its completed or immediately if AF occurs                  Garald Braver, MD

## 2021-02-21 ENCOUNTER — Encounter: Attending: Clinical Cardiac Electrophysiology | Primary: Internal Medicine

## 2021-03-19 ENCOUNTER — Encounter

## 2021-03-19 MED ORDER — CANDESARTAN 4 MG TAB
4 mg | ORAL_TABLET | Freq: Every day | ORAL | 3 refills | Status: AC
Start: 2021-03-19 — End: 2021-10-23

## 2021-03-19 MED ORDER — ROSUVASTATIN 20 MG TAB
20 mg | ORAL_TABLET | Freq: Every evening | ORAL | 3 refills | Status: DC
Start: 2021-03-19 — End: 2021-10-22

## 2021-03-19 MED ORDER — RIVAROXABAN 20 MG TAB
20 mg | ORAL_TABLET | ORAL | 3 refills | Status: AC
Start: 2021-03-19 — End: 2022-01-08

## 2021-03-19 NOTE — Telephone Encounter (Signed)
rosuvastatin (CRESTOR) 20 mg tablet  candesartan (ATACAND) 4 mg tablet  rivaroxaban (Xarelto) 20 mg tab tablet  [facility] [place]

## 2021-06-19 NOTE — Telephone Encounter (Signed)
XARELTO 20MG   CANDESARTAN 4MG   ROSUVASTATIN 20MG     90 DAY SUPPLY

## 2021-09-12 ENCOUNTER — Encounter

## 2021-09-12 NOTE — Telephone Encounter (Signed)
Please create lab order        Will mail to pt

## 2021-10-09 ENCOUNTER — Encounter: Payer: PRIVATE HEALTH INSURANCE | Attending: Cardiovascular Disease | Primary: Internal Medicine

## 2021-10-09 LAB — COMPREHENSIVE METABOLIC PANEL
ALT: 17 U/L (ref 9–46)
AST: 23 U/L (ref 10–35)
Albumin/Globulin Ratio: 1.5 (calc) (ref 1.0–2.5)
Albumin: 4.1 g/dL (ref 3.6–5.1)
Alkaline Phosphatase: 73 U/L (ref 35–144)
BUN: 23 mg/dL (ref 7–25)
CO2: 28 mmol/L (ref 20–32)
Calcium: 9.2 mg/dL (ref 8.6–10.3)
Chloride: 103 mmol/L (ref 98–110)
Creatinine: 0.93 mg/dL (ref 0.70–1.35)
Est, Glomerular Filtration Rate: 93 mL/min/{1.73_m2} (ref >=60)
Globulin: 2.8 g/dL (calc) (ref 1.9–3.7)
Glucose: 92 mg/dL (ref 65–99)
Potassium: 4 mmol/L (ref 3.5–5.3)
Sodium: 140 mmol/L (ref 135–146)
Total Bilirubin: 1 mg/dL (ref 0.2–1.2)
Total Protein: 6.9 g/dL (ref 6.1–8.1)

## 2021-10-09 LAB — CBC WITH AUTO DIFFERENTIAL
Basophils %: 0.4 % (ref 0–2)
Basophils Absolute: 34 cells/uL (ref 0–200)
Eosinophils %: 1.5 % (ref 0–8)
Eosinophils Absolute: 128 cells/uL (ref 15–500)
Hematocrit: 45.6 % (ref 38.5–50.0)
Hemoglobin: 15.1 g/dL (ref 13.2–17.1)
Lymphocytes %: 33.5 % (ref 15–49)
Lymphocytes Absolute: 2848 cells/uL (ref 850–3900)
MCH: 30.3 pg (ref 27.0–33.0)
MCHC: 33.1 g/dL (ref 32.0–36.0)
MCV: 91.6 fL (ref 80.0–100.0)
MPV: 10.7 fL (ref 7.5–12.5)
Monocytes %: 8.8 % (ref 0–13)
Monocytes Absolute: 748 cells/uL (ref 200–950)
Neutrophils %: 55.8 % (ref 38–80)
Neutrophils Absolute: 4743 cells/uL (ref 1500–7800)
Platelets: 198 10*3/uL (ref 140–400)
RBC: 4.98 10*6/uL (ref 4.20–5.80)
RDW: 13.1 % (ref 11.0–15.0)
WBC: 8.5 10*3/uL (ref 3.8–10.8)

## 2021-10-09 LAB — HEMOGLOBIN A1C W/O EAG
Hemoglobin A1C: 6.5 % of total Hgb — ABNORMAL HIGH (ref <5.7)
Hemoglobin A1c: 6.5 % of total Hgb — ABNORMAL HIGH (ref <5.7)

## 2021-10-09 LAB — TSH 3RD GENERATION
TSH: 1.01 mIU/L (ref 0.40–4.50)
TSH: 1.01 mIU/L (ref 0.40–4.50)

## 2021-10-09 LAB — LIPID PANEL
Chol/HDL Ratio: 5.1 (calc) — ABNORMAL HIGH (ref <5.0)
Cholesterol, Total: 173 mg/dL (ref <200)
Cholesterol, total: 173 mg/dL (ref <200)
Cholesterol/HDL ratio: 5.1 (calc) — ABNORMAL HIGH (ref <5.0)
HDL Cholesterol: 34 mg/dL — ABNORMAL LOW (ref >=40)
HDL: 34 mg/dL — ABNORMAL LOW (ref >=40)
LDL Cholesterol: 111 mg/dL (calc) — ABNORMAL HIGH
LDL-CHOLESTEROL: 111 mg/dL (calc) — ABNORMAL HIGH
Non-HDL Cholesterol: 139 mg/dL (calc) — ABNORMAL HIGH (ref <130)
Non-HDL Cholesterol: 139 mg/dL (calc) — ABNORMAL HIGH (ref <130)
Triglyceride: 160 mg/dL — ABNORMAL HIGH (ref <150)
Triglycerides: 160 mg/dL — ABNORMAL HIGH (ref <150)

## 2021-10-09 LAB — T4, FREE
T4 Free: 1.2 ng/dL (ref 0.8–1.8)
T4, Free: 1.2 ng/dL (ref 0.8–1.8)

## 2021-10-09 LAB — SEX HORMONE BINDING GLOBULIN
SEX HORMONE BINDING GLOBULIN, 500433: 24 nmol/L (ref 22–77)
SEX HORMONE BINDING GLOBULIN: 24 nmol/L (ref 22–77)

## 2021-10-09 LAB — TESTOSTERONE: TESTOSTERONE, TOTAL, MALES (ADULT): 256 ng/dL (ref 250–827)

## 2021-10-09 LAB — VITAMIN D 25 HYDROXY: Vit D, 25-Hydroxy: 76 ng/mL (ref 30–100)

## 2021-10-09 LAB — PSA PROSTATIC SPECIFIC ANTIGEN: PSA, TOTAL: 1.69 ng/mL (ref <=4.00)

## 2021-10-09 LAB — T3: T3, Total: 119 ng/dL (ref 76–181)

## 2021-10-09 LAB — CBC WITH AUTOMATED DIFF
ABS. BASOPHILS: 34 cells/uL (ref 0–200)
ABS. EOSINOPHILS: 128 cells/uL (ref 15–500)
ABS. LYMPHOCYTES: 2848 cells/uL (ref 850–3900)
ABS. MONOCYTES: 748 cells/uL (ref 200–950)
ABS. NEUTROPHILS: 4743 cells/uL (ref 1500–7800)
BASOPHILS: 0.4 % (ref 0–2)
EOSINOPHILS: 1.5 % (ref 0–8)
HCT: 45.6 % (ref 38.5–50.0)
HGB: 15.1 g/dL (ref 13.2–17.1)
LYMPHOCYTES: 33.5 % (ref 15–49)
MCH: 30.3 pg (ref 27.0–33.0)
MCHC: 33.1 g/dL (ref 32.0–36.0)
MCV: 91.6 fL (ref 80.0–100.0)
MEAN PLATELET VOLUME: 10.7 fL (ref 7.5–12.5)
MONOCYTES: 8.8 % (ref 0–13)
Neutrophils: 55.8 % (ref 38–80)
PLATELET: 198 10*3/uL (ref 140–400)
RBC: 4.98 10*6/uL (ref 4.20–5.80)
RDW: 13.1 % (ref 11.0–15.0)
WBC: 8.5 10*3/uL (ref 3.8–10.8)

## 2021-10-09 LAB — METABOLIC PANEL, COMPREHENSIVE
ALB/GLOBRATIO: 1.5 (calc) (ref 1.0–2.5)
ALT (SGPT): 17 U/L (ref 9–46)
AST (SGOT): 23 U/L (ref 10–35)
Albumin: 4.1 g/dL (ref 3.6–5.1)
Alkaline Phosphatase, total: 73 U/L (ref 35–144)
BUN: 23 mg/dL (ref 7–25)
Bilirubin, total: 1 mg/dL (ref 0.2–1.2)
CO2: 28 mmol/L (ref 20–32)
Calcium: 9.2 mg/dL (ref 8.6–10.3)
Chloride: 103 mmol/L (ref 98–110)
Creatinine: 0.93 mg/dL (ref 0.70–1.35)
Globulin: 2.8 g/dL (calc) (ref 1.9–3.7)
Glucose: 92 mg/dL (ref 65–99)
Potassium: 4 mmol/L (ref 3.5–5.3)
Protein, total: 6.9 g/dL (ref 6.1–8.1)
Sodium: 140 mmol/L (ref 135–146)
eGFR: 93 mL/min/{1.73_m2} (ref >=60)

## 2021-10-09 LAB — VITAMIN D, 25 HYDROXY: VITAMIN D, 25-HYDROXY: 76 ng/mL (ref 30–100)

## 2021-10-09 LAB — TEST AUTHORIZATION

## 2021-10-09 LAB — PSA, DIAGNOSTIC (PROSTATE SPECIFIC AG): PSA Total: 1.69 ng/mL (ref <=4.00)

## 2021-10-09 LAB — TESTOSTERONE, TOTAL, ADULT MALE: TESTOSTERONE, TOTAL, MALES (ADULT): 256 ng/dL (ref 250–827)

## 2021-10-09 LAB — T3 TOTAL: T3, total: 119 ng/dL (ref 76–181)

## 2021-10-22 MED ORDER — ROSUVASTATIN 40 MG TAB
40 mg | ORAL_TABLET | Freq: Every evening | ORAL | 3 refills | Status: DC
Start: 2021-10-22 — End: 2022-01-08

## 2021-10-22 NOTE — Progress Notes (Signed)
10/22/2021     Patient:  Bobby Guzman   DOB:  1961-01-15       CHIEF COMPLAINT:   Chief Complaint   Patient presents with    Irregular Heart Beat         HISTORY OF PRESENT ILLNESS: Bobby Guzman presents for cardiology follow up. Rare palps. Denies CP, diz, SOB. May be moving to [place] next year. Notes some forgetfulness, word finding difficulties, etc.    PAST MEDICAL HISTORY:  Past Medical History:   Diagnosis Date    Coronary artery disease     Diabetes ([place])     Endocarditis 2012    dx [place]; mitral valve; MV repair (Dr. Elio Forget [facility]) 11/16/15    Essential hypertension     Hyperlipidemia     Kidney stones     Lung cancer ([place]) 2011    s/p LUL resection and chemo    Primary malignant neoplasm of left upper lobe of lung ([place])     Sleep apnea     Ulcerative colitis ([place])         PAST SURGICAL HISTORY:  Past Surgical History:   Procedure Laterality Date    HX HEART CATHETERIZATION      HX MITRAL VALVULOPLASTY  11/16/2015    Mitral REPAIR and myomectomy with Dr. Andree Elk, [facility]    HX OTHER SURGICAL  2011    LUL resection    PR CHEST SURGERY PROCEDURE UNLISTED      left upper lobectomy       MEDICATIONS:  Current Outpatient Medications   Medication Sig Dispense Refill    ezetimibe (ZETIA) 10 mg tablet       Xyosted 75 mg/0.5 mL atIn INJECT 1 PEN SUBCUTANEOUSLY EVERY WEEK (MAX DAILY DOSE 1 PEN/WEEK)      rivaroxaban (Xarelto) 20 mg tab tablet TAKE 1 TAB BY MOUTH DAILY (WITH DINNER). 90 Tablet 3    candesartan (ATACAND) 4 mg tablet Take 0.5 Tablets by mouth daily. 45 Tablet 3    rosuvastatin (CRESTOR) 20 mg tablet Take 1 Tablet by mouth nightly. 90 Tablet 3    Ozempic 1 mg/dose (4 mg/3 mL) pnij       flash glucose sensor (FreeStyle Libre 2 Sensor) kit [employer] 2 Sensor kit      [employer] 14 Day Sensor kit       omega-3 acid ethyl esters (LOVAZA) 1 gram capsule Take 2 g by mouth two (2) times a day.      cyanocobalamin (VITAMIN B12) 100 mcg tablet Take 100 mcg by mouth daily.       ergocalciferol (ERGOCALCIFEROL) 1,250 mcg (50,000 unit) capsule Take 50,000 Units by mouth.      FREESTYLE LITE STRIPS strip USE TO TEST 4 TIMES A DAY AS DIRECTED E11.65  3    sub-q insulin device, 40 unit devi by Does Not Apply route.          ALLERGIES:   No Known Allergies    SOCIAL HISTORY:  Social History     Socioeconomic History    Marital status: MARRIED   Tobacco Use    Smoking status: Former     Packs/day: 2.00     Years: 34.00     Pack years: 68.00     Types: Cigarettes     Quit date: 02/20/2011     Years since quitting: 10.6    Smokeless tobacco: Never    Tobacco comments:     e- cig no nicotine  Substance and Sexual Activity    Alcohol use: Yes     Alcohol/week: 0.0 standard drinks     Comment: 2shots or beers /week    Drug use: No         FAMILY HISTORY:  Family History   Problem Relation Age of Onset    Stroke Mother     Heart Attack Father     Stone Father         kidney    High Cholesterol Sister     Hypertension Sister     High Cholesterol Brother     Hypertension Brother        REVIEW OF SYSTEMS:  Neuro: No headache, focal weakness/numbness, parasthesias  Eyes: No visual disturbance  ENMT: No soar throat, nasal congestion, hearing loss, tinnitus  CV: No chest pain, dizziness, syncope, claudication  Resp: No dyspnea, cough  GI: No abdominal pain, N/V, diarrhea, constipation  GU: No hematuria, dysuria  Skin: No rash  Heme: No bleeding, bruising  Constitutional: No fatigue, weakness, fever, weight change  Musculoskeletal: No myalgias, arthralgias  Other:    PHYSICAL EXAM:  Blood pressure 134/76, pulse 82, height 5' 8" (1.727 m), weight 250 lb (113.4 kg), SpO2 97 %.   Body mass index is 38.01 kg/m??.   General: Well developed, well nourished, no acute distress, appears stated age  Eyes: Anicteric, no hemorrhage, normal appearing conjunctivae and lids  ENMT: Normal externally  Neck: Supple, no JVD, no carotid bruit  CV: RRR, normal S1S2, no M/G/R  Lungs: CTA bilaterally, normal respiratory  excursion  Abd: Soft, NT, ND, no palpable mass  Extremities: No pedal edema  Skin: Warm and dry  Musculoskeletal: Normal muscle tone, normal gait  Neuro: Nonfocal exam, no rigidity/tremor  Psych: Alert and oriented, appropriate affect      LABS AND TESTING:  EKG: NSR, LBBB (unchanged)   Labs: 09/2021 - CBC nl, CMP nl, K 4, TG 160, HDL 34, LDL 111, A1c 6.5, TSH nl  TEE 03/07/15 - nl LV/RV size/fxn, anterior MV leaflet with rupture, severe MR  Cath 11/02/15 - no significant CAD, severe MR, LVEDP 21  Echo 12/13/15 - moderate LVH, LVEF 50-55, severe LAE, nl RV, MV annuloplasty ring with trace MR  24 hr holter 12/13/15 - NSR 60-98 (avg 75), IVCD, occasional PVC, NSVT vs SVT (more likely) x 1 for 13 beats, no AF  Echo 04/2017 - moderate LVH, LVEF 50-55, severe LAE, nl RV, MV annuloplasty ring with trace MR, trace AI  Echo 02/2018 - TDS, mod LVH, cannot determine LVEF, nl RV, severe LAE, MV annuloplasty ring, trace AI  Echo 03/2018 - Definity used, LVEF 50-55  Echo 12/2020 - Definity used, LVEF 50-55, nl RV, severe LAE, aortic sclerosis, MV annuloplasty ring with MG 8 mmHg and trace regurg, mild pulm HTN    ASSESSMENT AND PLAN:    1. HTN - Blood pressure is reasonably well controlled. Continue current antihypertensive regimen.   2. Dyslipidemia - Recent lipids abnormal. Increase crestor to 40, continue zetia 10 and follow up labs.   3. Hx endocarditis and subsequent MV rupture with severe MR - S/p MV repair 11/16/15. Clinically stable in this regard. Plan on serial echo and dental endocarditis ppx going forward.  4. Atrial fibrillation - Paroxysmal with infrequent but very symptomatic recurrences. Currently/primarily in sinus rhythm. CHADS-VASc 2. Continue xarelto 20. EP eval prior -- will reconsult if symptoms worsen.  5. OSA - Pulm followup. Uses CPAP sporadically.  6. Obesity - Bariatric  surg referral given previously. Never followed through and now reluctant as he will be relocating.    Bobby Bash, MD

## 2021-10-23 ENCOUNTER — Encounter

## 2021-10-23 MED ORDER — CANDESARTAN 4 MG TAB
4 mg | ORAL_TABLET | Freq: Every day | ORAL | 3 refills | Status: DC
Start: 2021-10-23 — End: 2022-01-08

## 2021-10-23 NOTE — Telephone Encounter (Signed)
ATACAND 4 MG PLEASE REFILL

## 2022-01-08 ENCOUNTER — Telehealth

## 2022-01-08 ENCOUNTER — Encounter

## 2022-01-08 MED ORDER — CANDESARTAN 4 MG TAB
4 mg | ORAL_TABLET | Freq: Every day | ORAL | 0 refills | Status: AC
Start: 2022-01-08 — End: 2022-04-16

## 2022-01-08 MED ORDER — ROSUVASTATIN 40 MG TAB
40 mg | ORAL_TABLET | Freq: Every evening | ORAL | 0 refills | Status: AC
Start: 2022-01-08 — End: 2022-04-16

## 2022-01-08 MED ORDER — RIVAROXABAN 20 MG TAB
20 mg | ORAL_TABLET | ORAL | 0 refills | Status: AC
Start: 2022-01-08 — End: 2022-04-16

## 2022-01-08 NOTE — Telephone Encounter (Signed)
rosuvastatin (CRESTOR) 40 mg tablet  rivaroxaban (Xarelto) 20 mg tab tablet  candesartan (ATACAND) 4 mg tablet      3 months supply

## 2022-02-12 ENCOUNTER — Encounter: Attending: Cardiovascular Disease | Primary: Internal Medicine

## 2022-04-16 MED ORDER — EZETIMIBE 10 MG TAB
10 mg | ORAL_TABLET | Freq: Every day | ORAL | 0 refills | Status: AC
Start: 2022-04-16 — End: 2022-07-15

## 2022-04-16 MED ORDER — RIVAROXABAN 20 MG TAB
20 mg | ORAL_TABLET | ORAL | 0 refills | Status: AC
Start: 2022-04-16 — End: ?

## 2022-04-16 MED ORDER — CANDESARTAN 4 MG TAB
4 mg | ORAL_TABLET | Freq: Every day | ORAL | 0 refills | Status: AC
Start: 2022-04-16 — End: ?

## 2022-04-16 MED ORDER — ROSUVASTATIN 40 MG TAB
40 mg | ORAL_TABLET | Freq: Every evening | ORAL | 0 refills | Status: AC
Start: 2022-04-16 — End: ?

## 2022-04-16 NOTE — Telephone Encounter (Signed)
Refill to [facility] // [address]     90day supply w/ refills     candesartan (ATACAND) 4 mg tablet    rivaroxaban (Xarelto) 20 mg tab tablet    rosuvastatin (CRESTOR) 40 mg tablet    Thank you

## 2022-04-22 ENCOUNTER — Encounter: Attending: Cardiovascular Disease | Primary: Internal Medicine

## 2022-04-23 ENCOUNTER — Encounter: Attending: Urology | Primary: Internal Medicine

## 2022-04-24 ENCOUNTER — Encounter: Attending: Student in an Organized Health Care Education/Training Program | Primary: Internal Medicine

## 2022-06-04 ENCOUNTER — Encounter: Payer: PRIVATE HEALTH INSURANCE | Attending: Cardiovascular Disease | Primary: Internal Medicine

## 2022-06-11 ENCOUNTER — Encounter: Payer: PRIVATE HEALTH INSURANCE | Attending: Cardiovascular Disease | Primary: Internal Medicine

## 2022-06-11 ENCOUNTER — Encounter
Payer: PRIVATE HEALTH INSURANCE | Attending: Student in an Organized Health Care Education/Training Program | Primary: Internal Medicine

## 2022-06-21 ENCOUNTER — Encounter: Attending: Student in an Organized Health Care Education/Training Program | Primary: Internal Medicine

## 2022-08-20 IMAGING — CT CT CHEST/ABDOMEN/PELVIS WITH CONTRAST
2 of 7 series · 11 of 36 positions shown, 17 images · IV contrast (isovue)
Comparison: None

________________________________________________________________________________________________ 
******** ADDENDUM #1 ********/n 
Comparison exam from January 2021 now available. 
The changes of underlying bronchitis have developed since the prior examination. 
Stable scattered micronodules. No new abnormality identified. 
CT CHEST/ABDOMEN/PELVIS WITH CONTRAST, 08/20/2022 [DATE]: 
CLINICAL INDICATION: Follow-up lung carcinoma. Dizziness weakness and fatigue. 
Intermittent shortness of breath. History of ulcerative colitis. History of lung 
carcinoma 4899 treated with left upper lobectomy emphysema therapy. 
A search for DICOM formatted images was conducted for prior CT imaging studies 
completed at a non-affiliated media free facility.
TECHNIQUE: The chest, abdomen and pelvis were scanned from base of neck through 
the pubic rami 100 mL of Isovue 300 injected intravenously on a high resolution 
low dose CT scanner. 0 Routine MPR and MIP 3D renderings performed with 
concurrent physician supervision.

[Series 13: coronal · coronal · 0.73mm/px · 1 of 158 slices shown, 2 images]
[im 79/158  mediastinal]
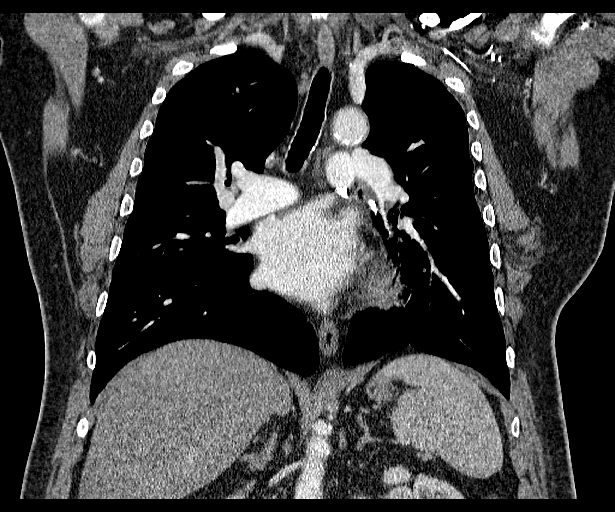
[im 79/158  bone]
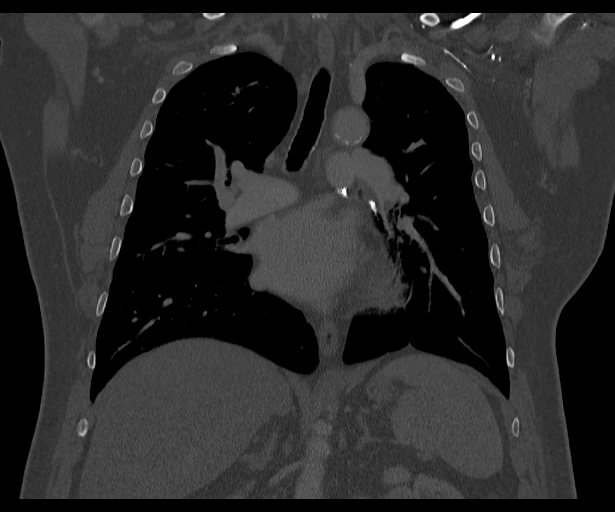

[Series 15: portal 3.0 i41s 2 · axial · portal-venous · 0.90mm/px · z∈[+674,+1118]mm · 10 of 178 slices shown, 15 images]
[im 15/178  mediastinal]
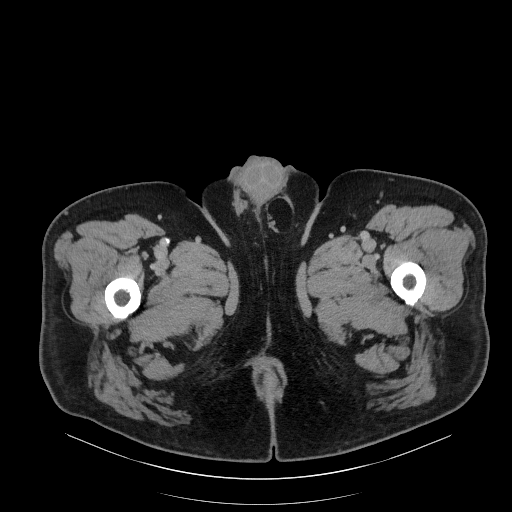
[im 15/178  bone]
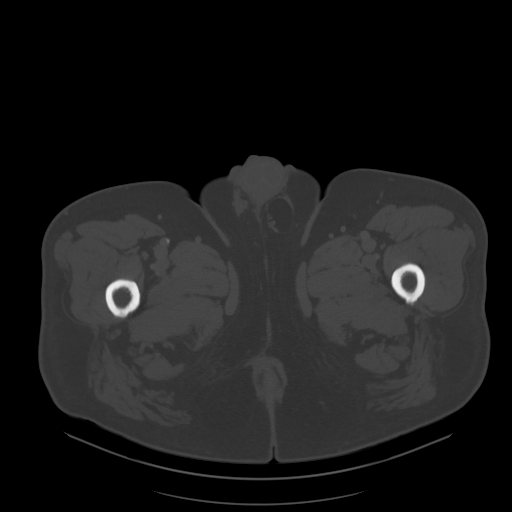
[im 30/178  mediastinal]
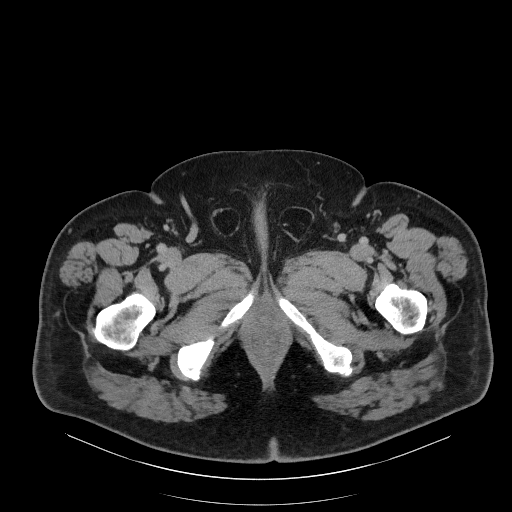
[im 60/178  mediastinal]
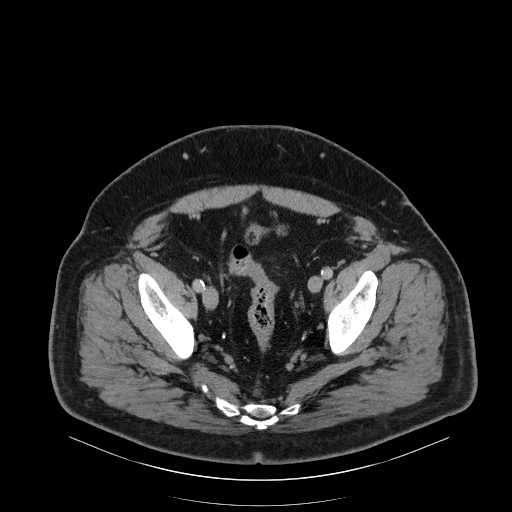
[im 74/178  mediastinal]
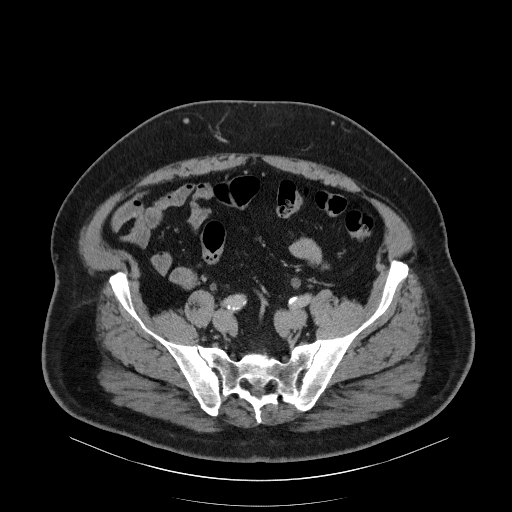
[im 89/178  mediastinal]
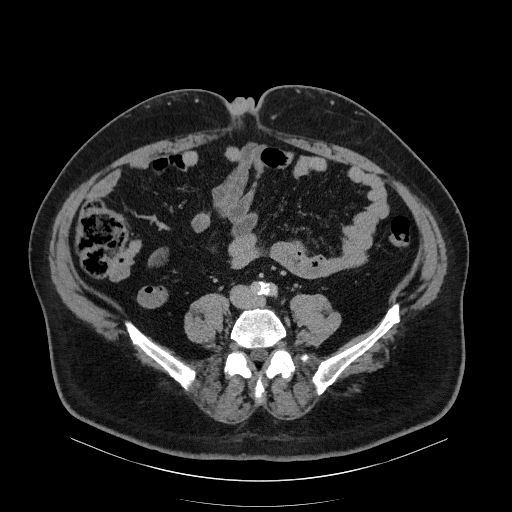
[im 104/178  mediastinal]
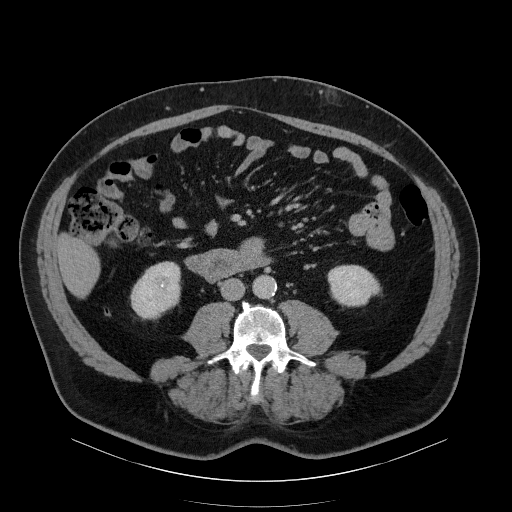
[im 119/178  mediastinal]
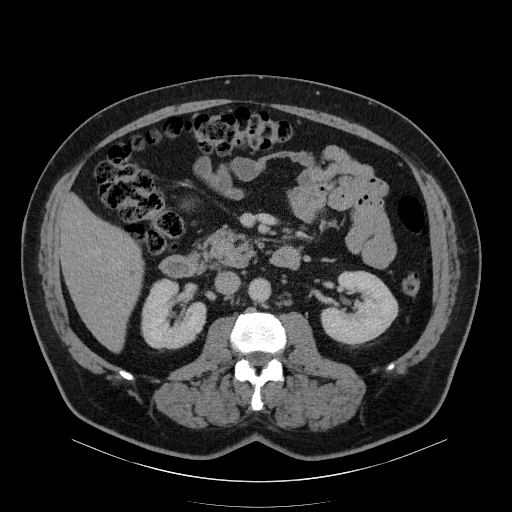
[im 119/178  lung]
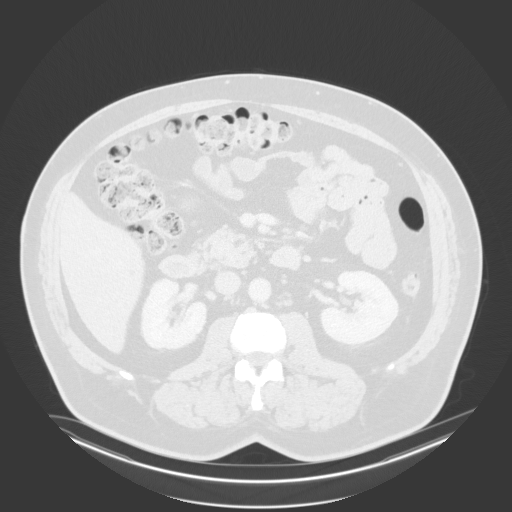
[im 133/178  lung]
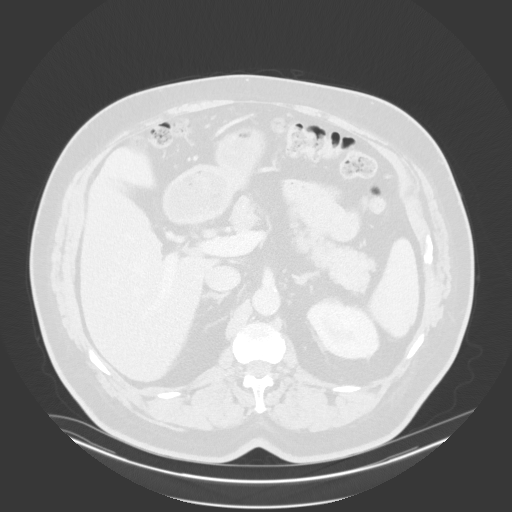
[im 148/178  mediastinal]
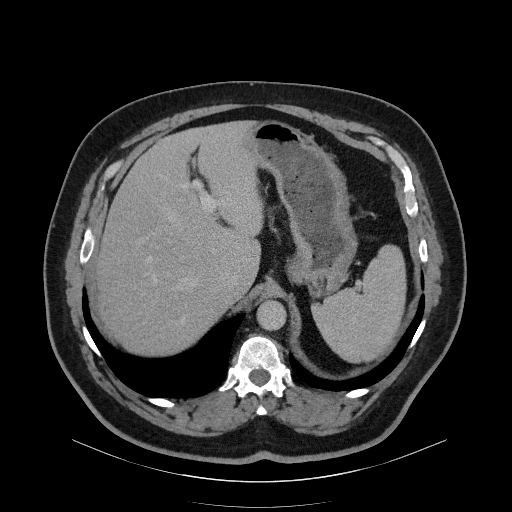
[im 148/178  lung]
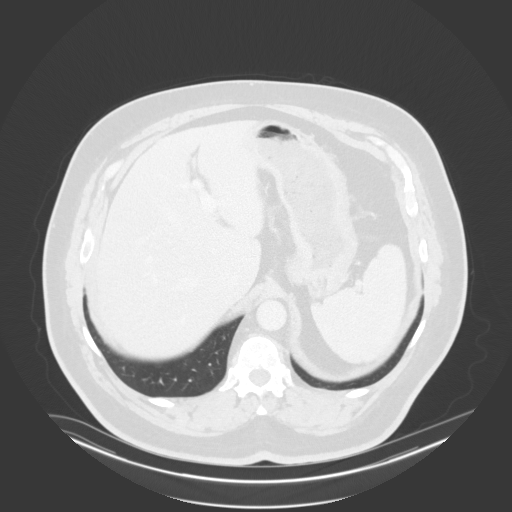
[im 163/178  mediastinal]
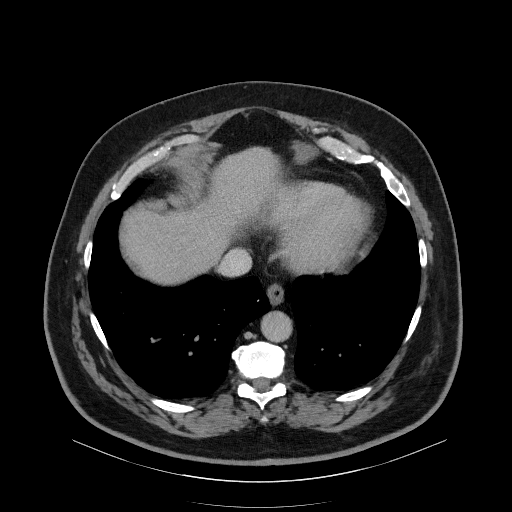
[im 163/178  lung]
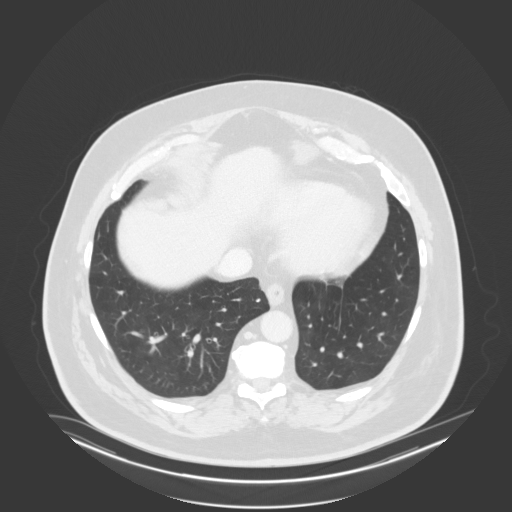
[im 163/178  bone]
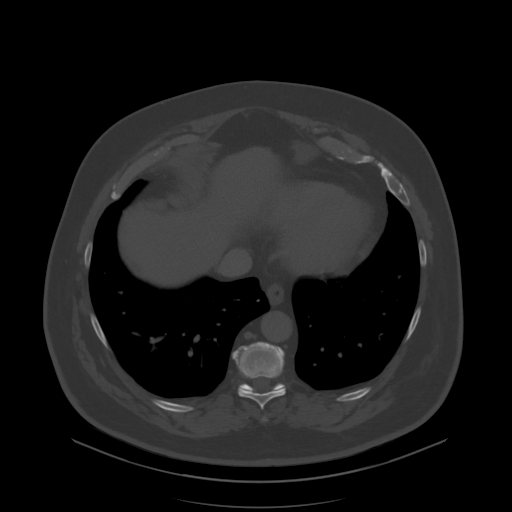

[11 of 36 positions shown; findings below may reference images not displayed]

FINDINGS: LUNGS AND PLEURA:  Previous left upper lobectomy. Atelectatic changes seen 
anteriorly in the left lower lobe. Atelectatic changes also seen along the 
inferior aspect of the right upper lobe anteriorly. Few scattered micronodules. 
None larger than 2 mm best seen on the MIPS slab reconstructed images. I do not 
have prior films for direct comparison. There are multiple areas of bronchial 
wall thickening suggesting mild underlying bronchitis. No pleural effusion seen. 
MEDIASTINUM:  Postoperative changes. Mild coronary calcifications and changes of 
old granulomatous disease. No suspicious adenopathy identified.  
CHEST WALL/AXILLA: No mass or adenopathy.  
HEPATOBILIARY: No evidence for mass or biliary dilatation. No gallstones. 
SPLEEN: Normal in size. 
PANCREAS: No ductal dilatation or mass.   
ADRENALS: No mass. 
GENITOURINARY: Bilateral nonobstructing renal calculi. Largest on the right 
inferiorly at 1 cm with an average Hounsfield unit 6653. 2 smaller calculi are 
seen inferiorly on the left no greater than 2 mm in diameter. No obstructive 
changes. No bladder mass. 
LYMPH NODES: No adenopathy. 
STOMACH, SMALL BOWEL AND COLON: No bowel wall thickening or obstruction. 
VASCULAR STRUCTURES: Atherosclerotic changes without aneurysmal dilatation.  
MUSCULOSKELETAL: No acute osseous abnormality. Scattered degenerative changes.  
ADDITIONAL FINDINGS: None.
IMPRESSION: Postop changes seen within the chest. Few scattered micronodules measuring no 
greater than 2 mm. Changes of underlying bronchitis. 
Atherosclerotic changes and degenerative changes. Nonobstructing bilateral renal 
calculi. 
RADIATION DOSE REDUCTION: All CT scans are performed using radiation dose 
reduction techniques, when applicable.  Technical factors are evaluated and 
adjusted to ensure appropriate moderation of exposure.  Automated dose 
management technology is applied to adjust the radiation doses to minimize 
exposure while achieving diagnostic quality images.

## 2022-08-20 IMAGING — CT CT BRAIN W/WO CONTRAST
3 of 6 series · 15 of 47 positions shown, 18 images · IV contrast (APPLIED)
Comparison: None.

________________________________________________________________________________________________ 
CT BRAIN W/WO CONTRAST, 08/20/2022 [DATE]: 
CLINICAL INDICATION: Lung cancer. 
A search for DICOM formatted images was conducted for prior CT imaging studies 
completed at a non-affiliated media free facility.
TECHNIQUE: The head was scanned from vertex through skull base without and with 
100 mL of Isovue 300 injected intravenously on a high resolution CT scanner 
using dose reduction techniques. MPR reconstructions were performed.

[Series 2: head 3.0 j30s 1 · axial · 0.47mm/px · z∈[-148,-4]mm · 9 of 58 slices shown, 12 images]
[im 5/58  brain]
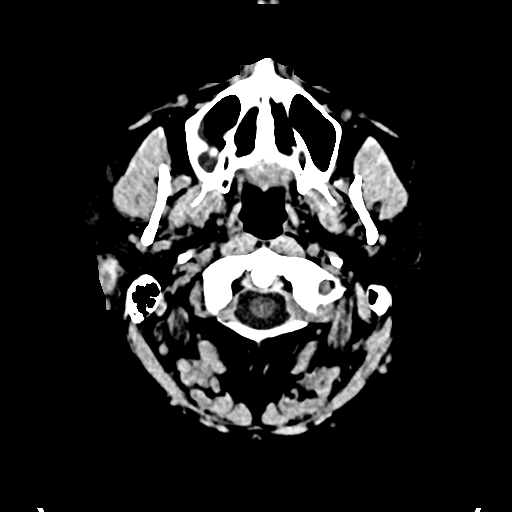
[im 5/58  bone]
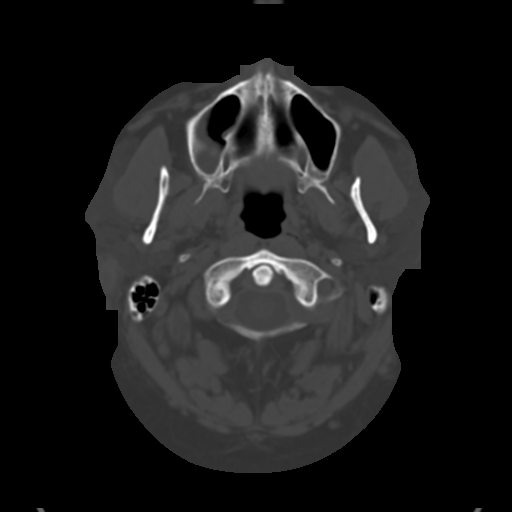
[im 13/58  brain]
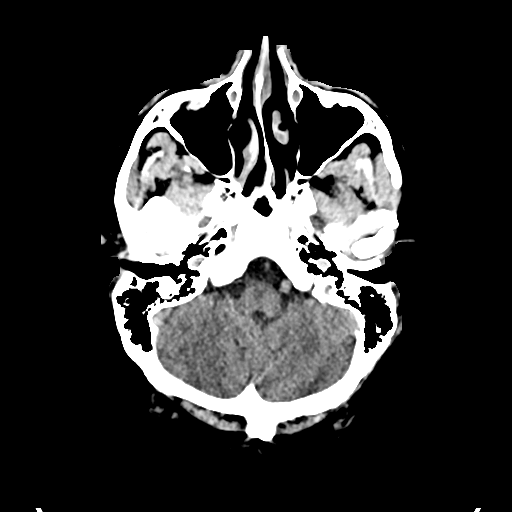
[im 17/58  brain]
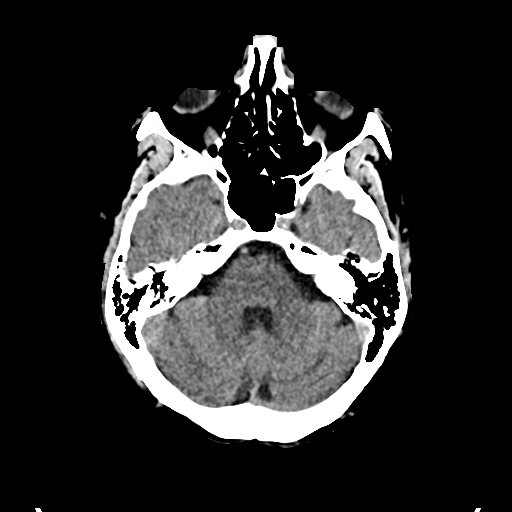
[im 25/58  brain]
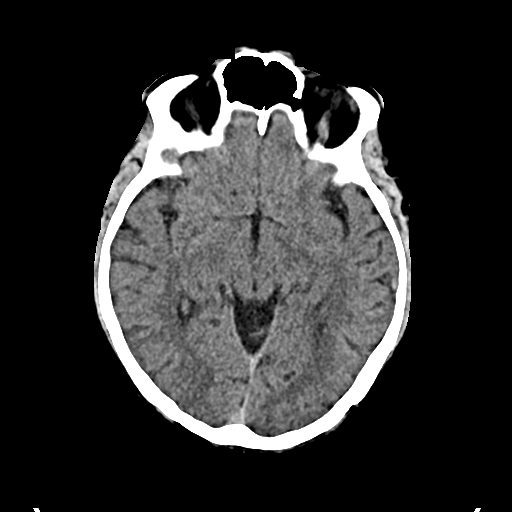
[im 29/58  brain]
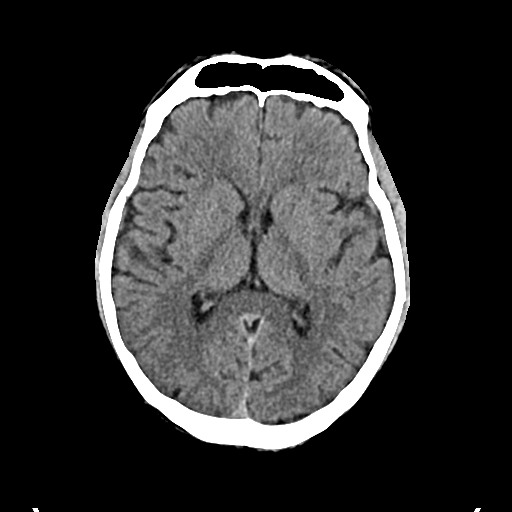
[im 29/58  bone]
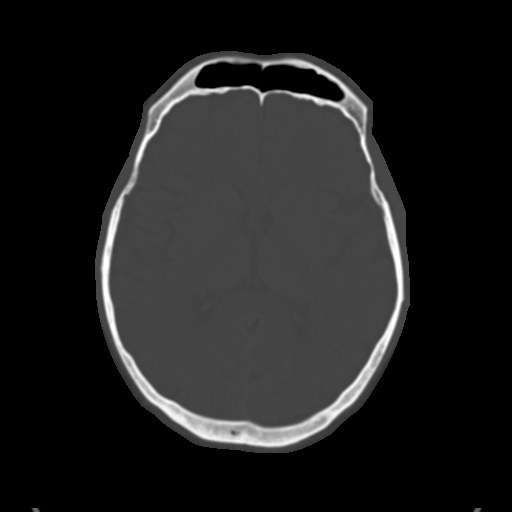
[im 33/58  brain]
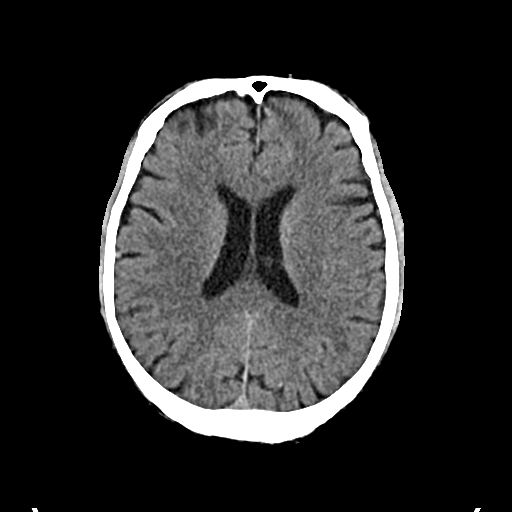
[im 41/58  brain]
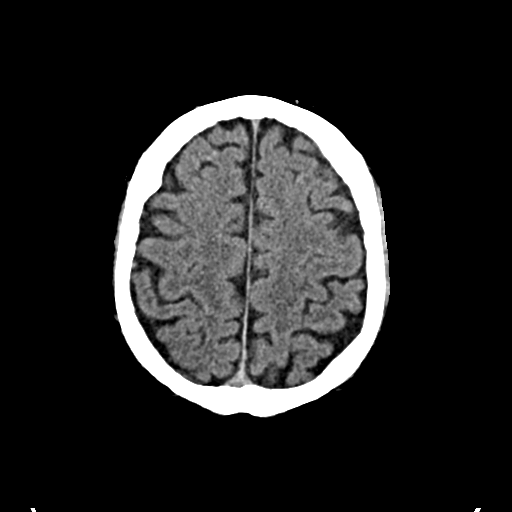
[im 45/58  brain]
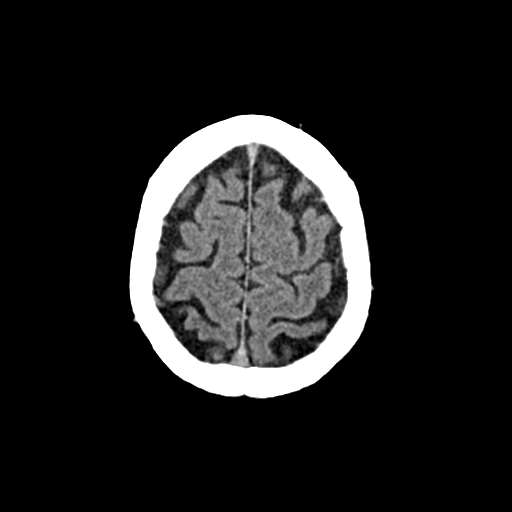
[im 53/58  brain]
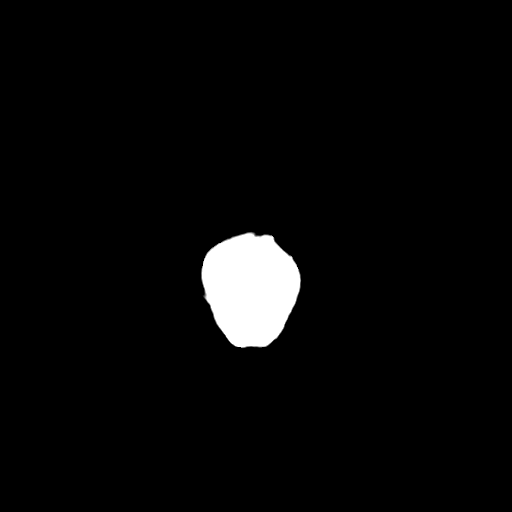
[im 53/58  bone]
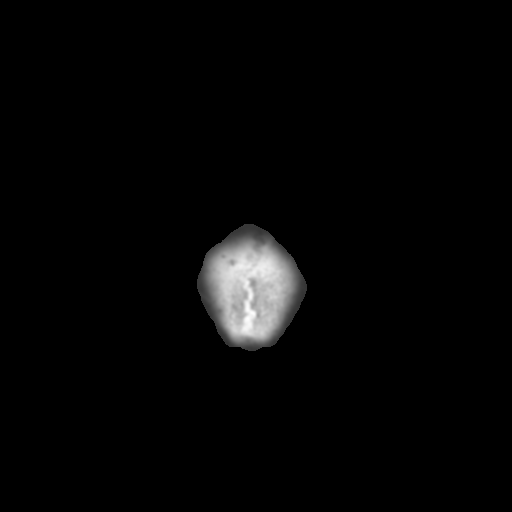

[Series 4: coronal · coronal · 0.37mm/px · 3 of 75 slices shown]
[im 19/75  brain]
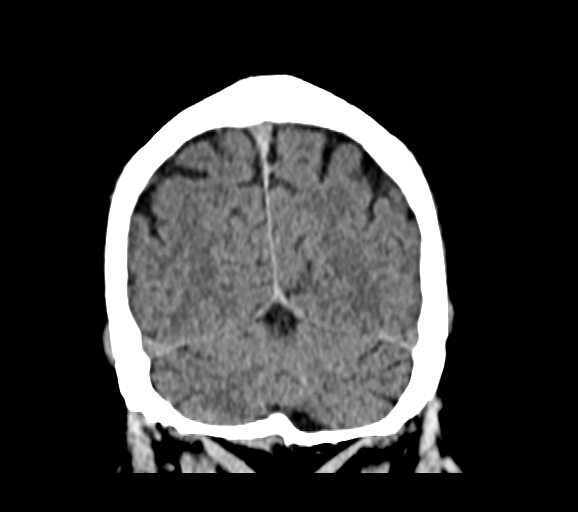
[im 38/75  brain]
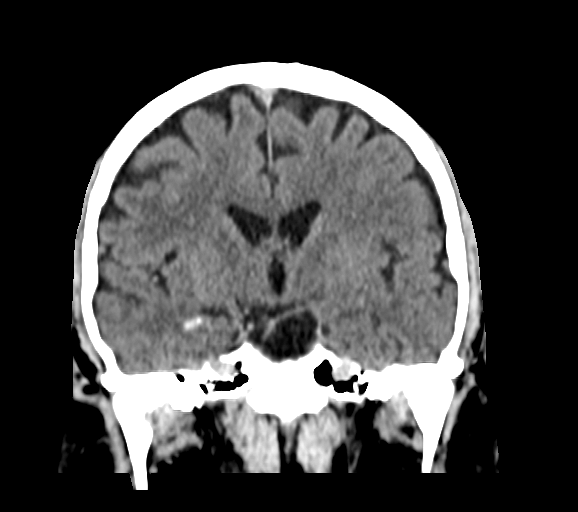
[im 56/75  brain]
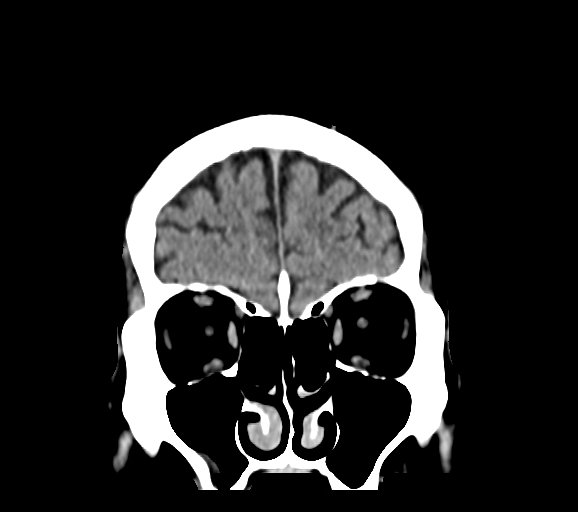

[Series 21: sagittal with · sagittal · 0.38mm/px · 3 of 59 slices shown]
[im 9/59  brain]
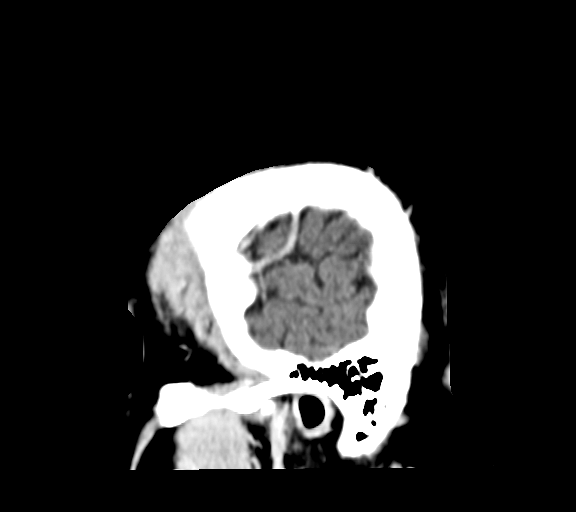
[im 25/59  brain]
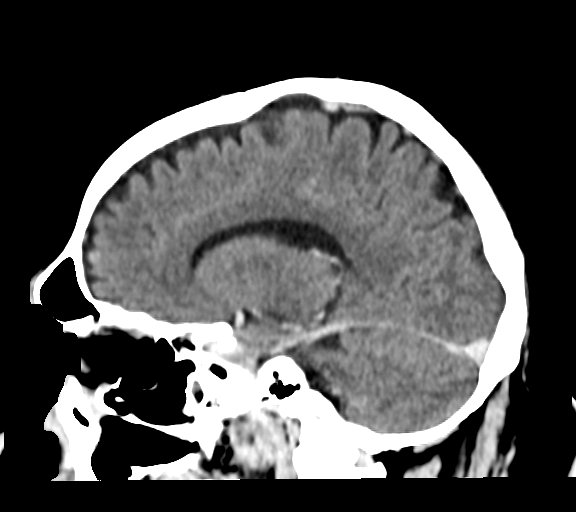
[im 42/59  brain]
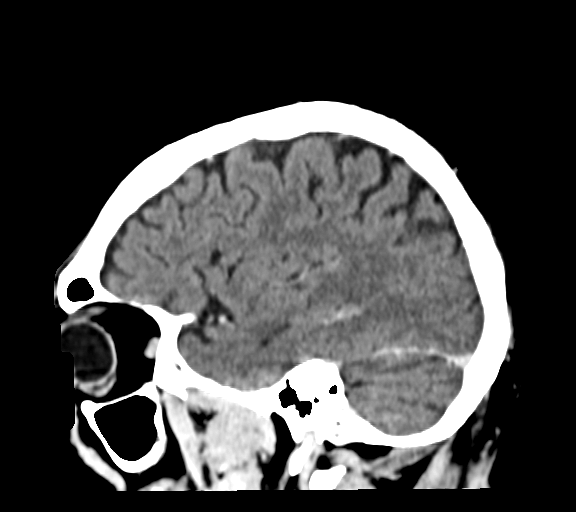

[15 of 47 positions shown; findings below may reference images not displayed]

FINDINGS: --------------------------------------------------------------------      
INTRACRANIAL: 
Punctate calcification the right superior frontal subcortical white matter. No 
acute intracranial hemorrhage, mass effect, midline shift. No large acute 
territorial ischemic change. Cerebral volume is age appropriate.  No 
hydrocephalus.  
-------------------------------------------------------------------- 
OTHER:  
ORBITS/SINUSES/T-BONES:  Mild mucosal thickening in the right maxillary sinus. 
Orbits are unremarkable. Mastoid air cells and middle ear cavities are clear. 
BONES/SOFT TISSUES: No acute abnormality. 
--------------------------------------------------------------------
IMPRESSION: 1.  No acute intracranial abnormality or pathologic enhancement. 
2.  If clinical symptoms persist, MRI is suggested. 
RADIATION DOSE REDUCTION: All CT scans are performed using radiation dose 
reduction techniques, when applicable.  Technical factors are evaluated and 
adjusted to ensure appropriate moderation of exposure.  Automated dose 
management technology is applied to adjust the radiation doses to minimize 
exposure while achieving diagnostic quality images.

## 2023-03-19 IMAGING — MR MRA BRAIN WITHOUT CONTRAST
4 of 5 series · 10 of 48 positions shown · non-contrast
Comparison: None.

________________________________________________________________________________________________ 
MRA BRAIN WITHOUT CONTRAST, 03/19/2023 [DATE]: 
CLINICAL INDICATION: Narcolepsy. Memory difficulty. Family history of aneurysm.
TECHNIQUE: MR arteriography with MIPs of the brain is performed with computer 
reformatting of the source data to create arteriographic images. Patient was 
scanned on a 1.5 magnet.

[Series 101: smartbrain · sagittal · 2.2mm · 1.09mm/px · 3 of 100 slices shown]
[im 10/100]
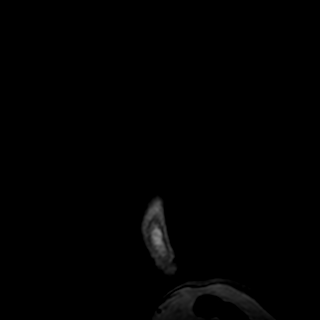
[im 50/100]
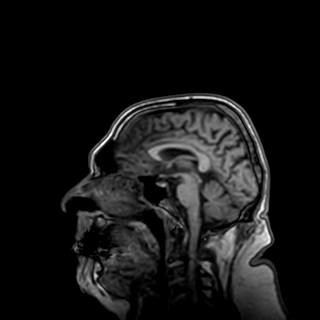
[im 90/100]
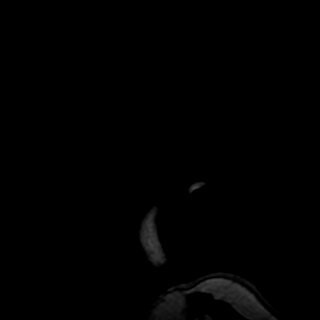

[Series 301: 3d_tof_cow · axial · 1.0mm · 0.38mm/px · z∈[-72,-1]mm · 3 of 200 slices shown]
[im 29/200]
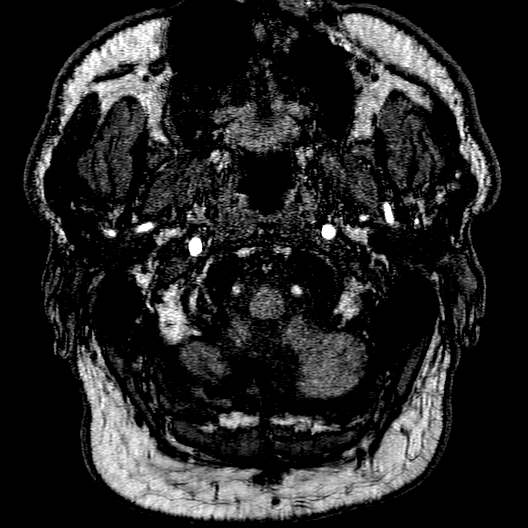
[im 105/200]
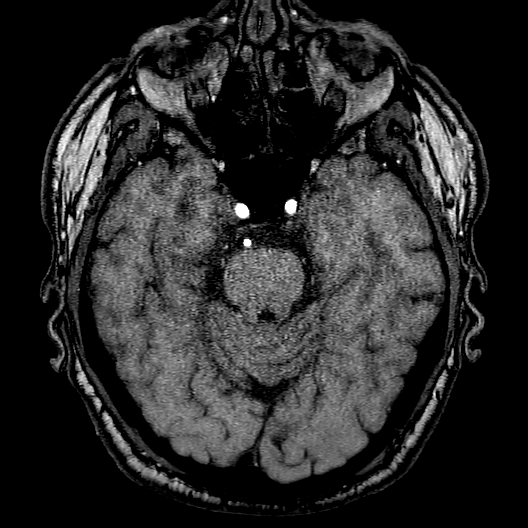
[im 171/200]
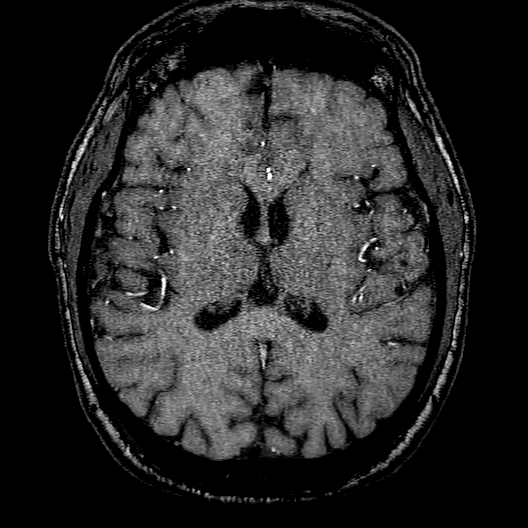

[Series 303: cor mpr · coronal · 2.0mm · 0.20mm/px · 3 of 62 slices shown]
[im 11/62]
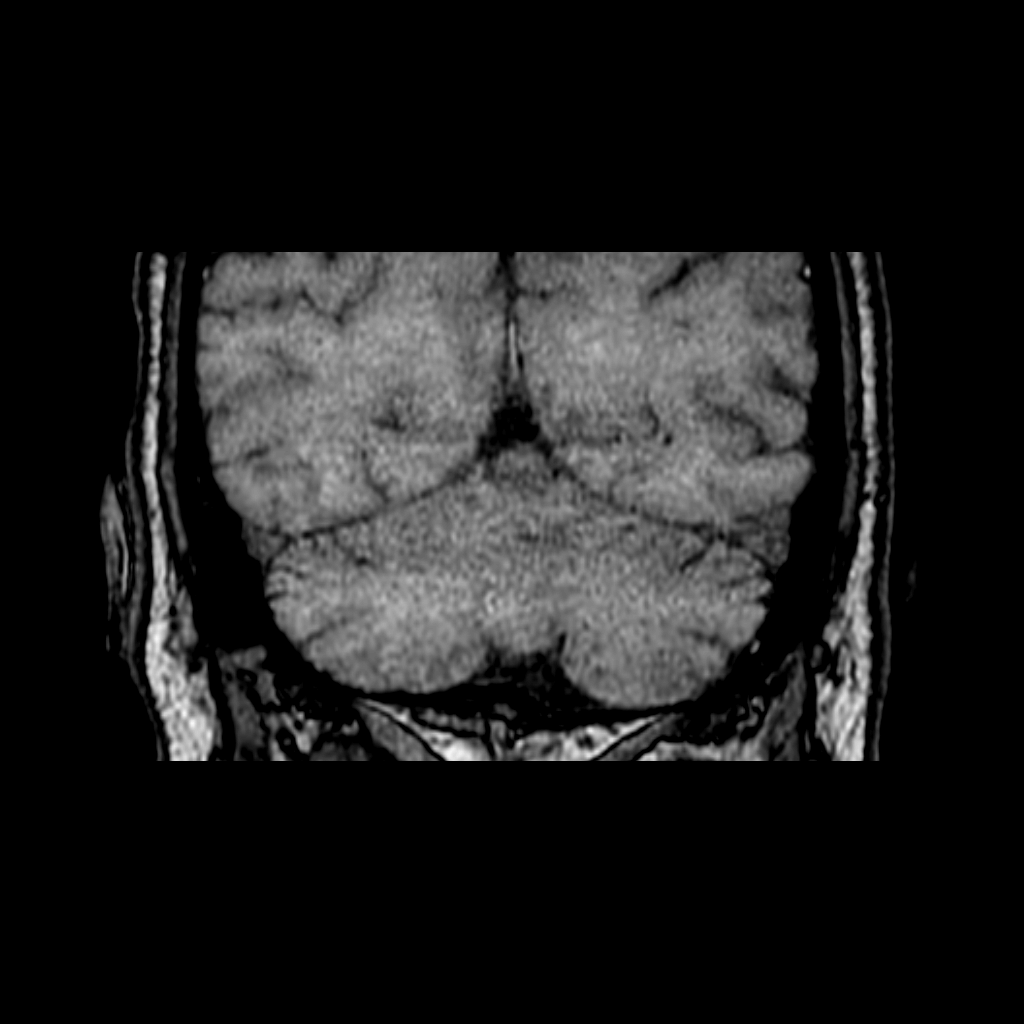
[im 31/62]
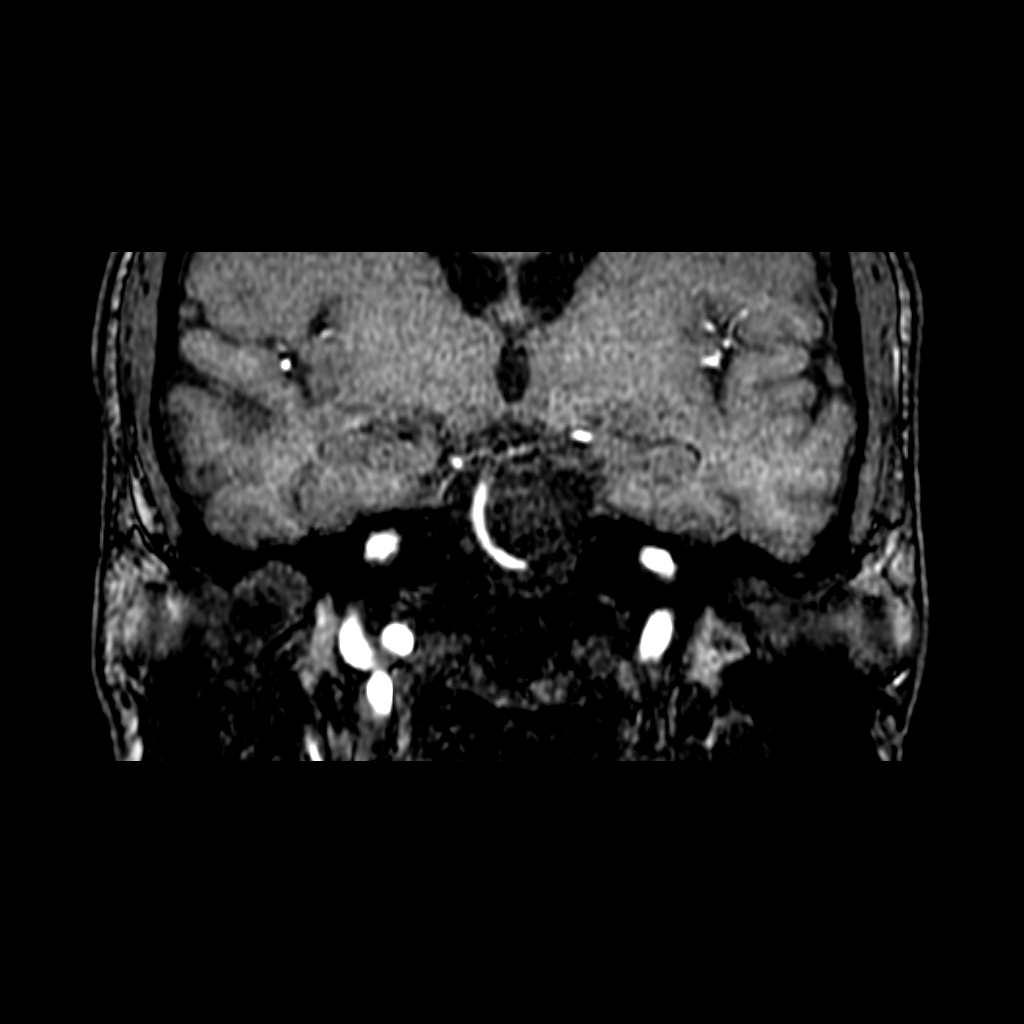
[im 51/62]
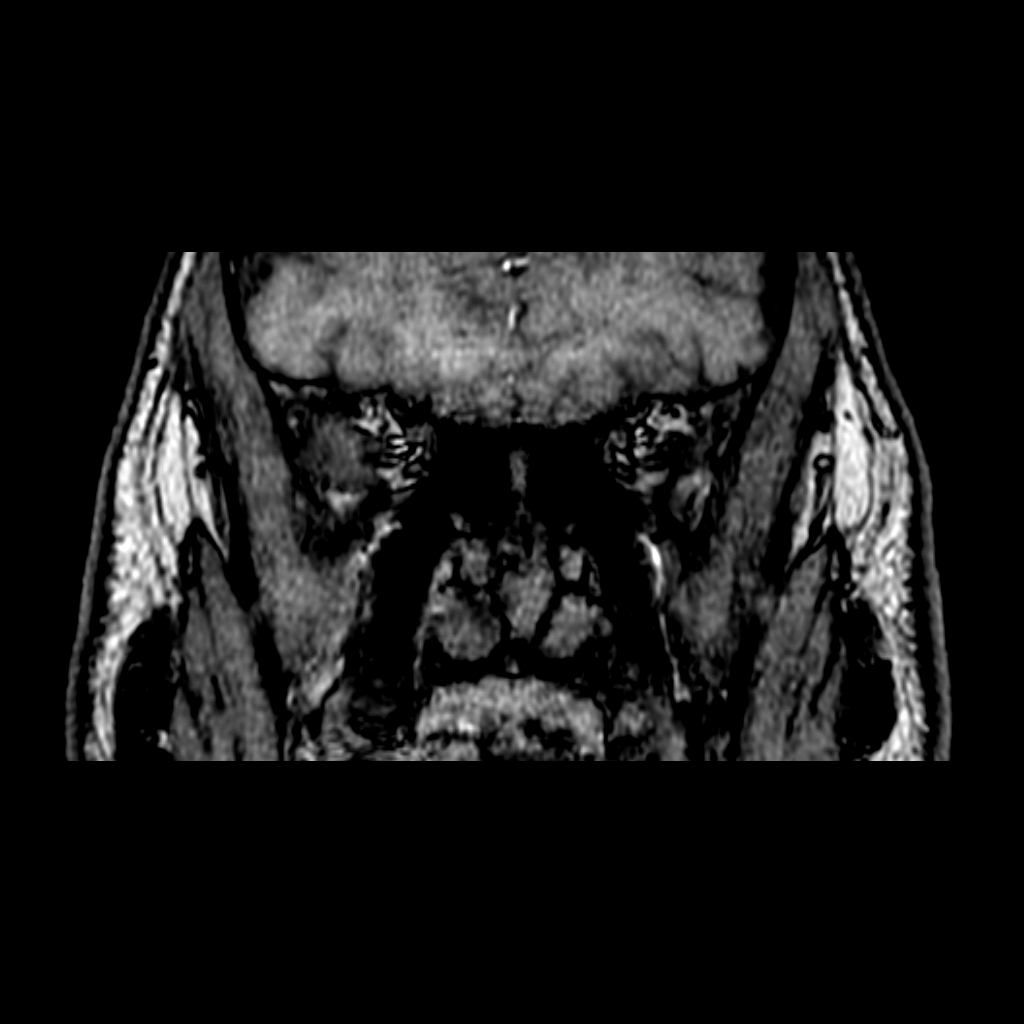

[Series 304: sag mpr · sagittal · 2.0mm · 0.20mm/px · 1 of 65 slices shown]
[im 11/65]
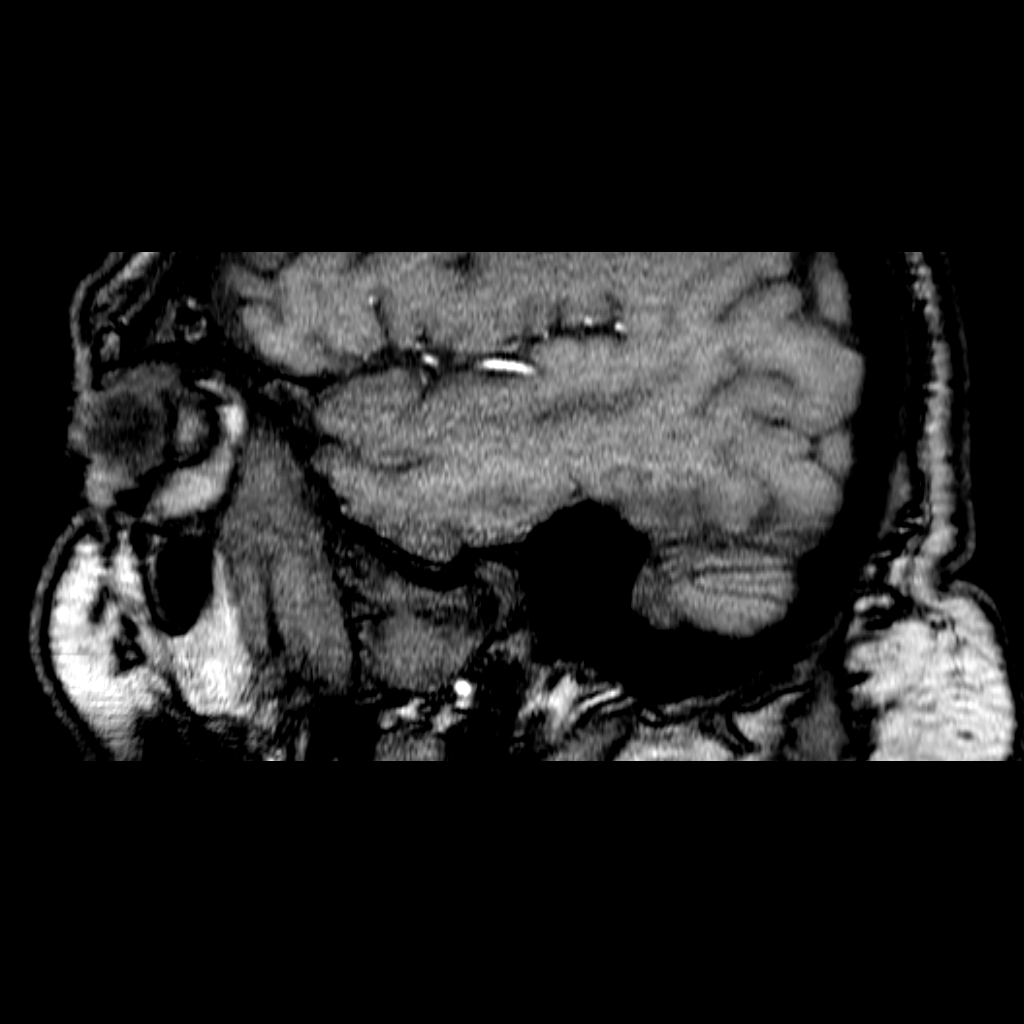

[10 of 48 positions shown; findings below may reference images not displayed]

FINDINGS: RIGHT ANTERIOR CIRCULATION: Distal internal carotid artery and anterior and 
middle cerebral arteries without significant stenosis. 

LEFT ANTERIOR CIRCULATION: Distal internal carotid artery and anterior and 
middle cerebral arteries without significant stenosis. 

POSTERIOR CIRCULATION: Right vertebral artery is atretic distally. Left 
vertebral artery is of normal caliber. Fetal type right posterior cerebral 
artery. 

ANEURYSM/AVM: No aneurysm. Please note, MRA is less sensitive for aneurysms less 
than 4 mm in size.  There is no vascular nidus to suggest high-grade 
arteriovenous malformation.
IMPRESSION: No significant focal intracranial stenosis or evidence of intracranial 
aneurysm..

## 2023-03-19 IMAGING — MR MRA CAROTID W/WO CONTRAST
3 series · 16 of 16 positions shown · IV contrast (Gadolinium)
Comparison: None.

________________________________________________________________________________________________ 
MRA CAROTID W/WO CONTRAST, 03/19/2023 [DATE]:
INDICATION: Narcolepsy. Memory loss.
TECHNIQUE: 2D TOF through the neck and 3D TOF through the carotid bifurcations. 
 MIP reconstructions performed.

[Series 1301: survey_pca · sagittal · 50.0mm · 1.17mm/px · 1 of 4 slices shown]
[im 1/4]
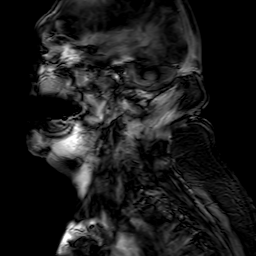

[Series 1401: (person_name)2(person_name) · axial · 3.0mm · 0.45mm/px · z∈[-260,-43]mm · 8 of 110 slices shown]
[im 1/110]
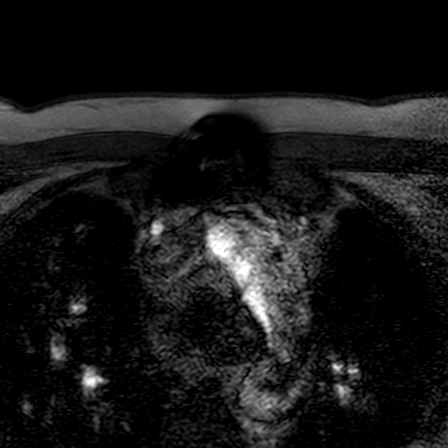
[im 16/110]
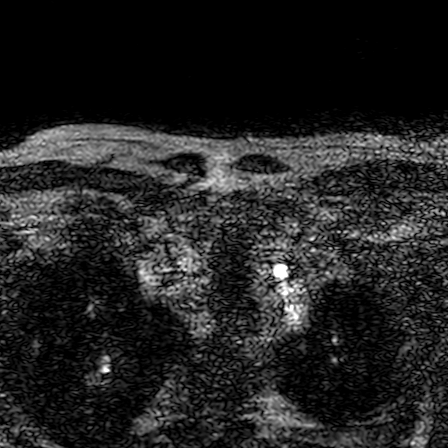
[im 32/110]
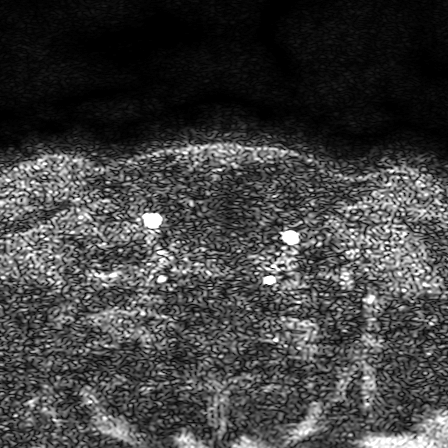
[im 47/110]
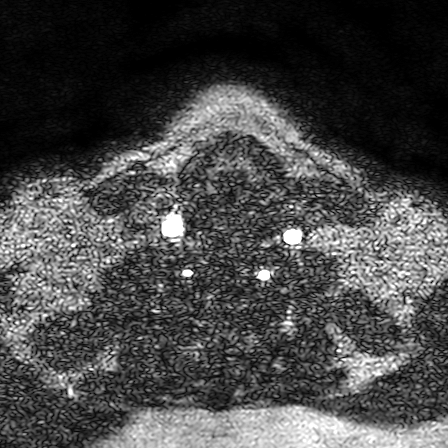
[im 63/110]
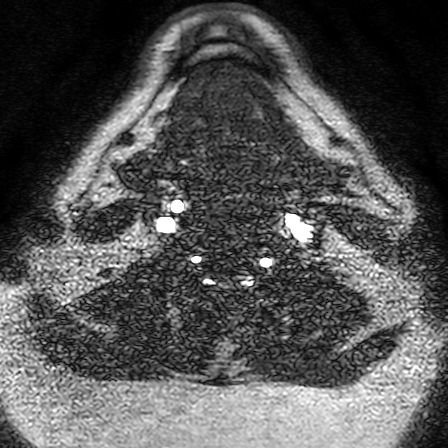
[im 78/110]
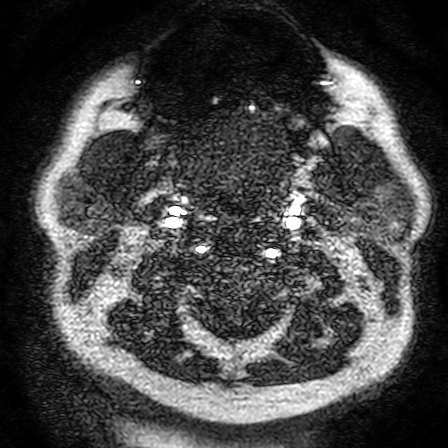
[im 94/110]
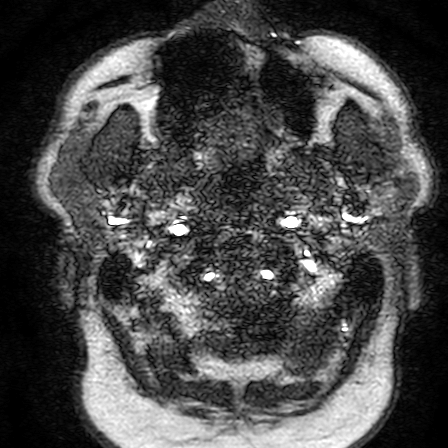
[im 110/110]
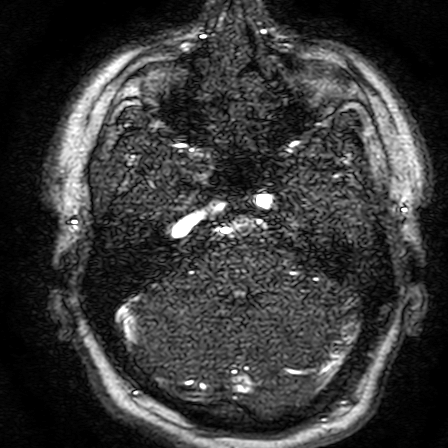

[Series 1501: 3(person_name)_(person_name) · axial · 1.2mm · 0.43mm/px · z∈[-155,-96]mm · 7 of 100 slices shown]
[im 1/100]
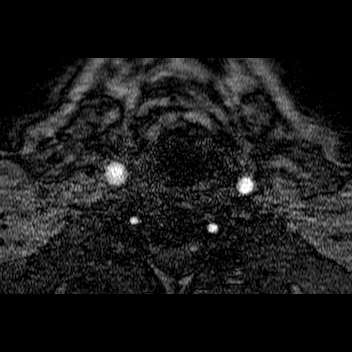
[im 17/100]
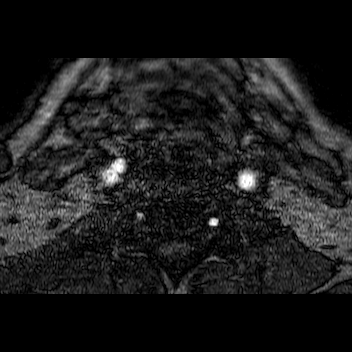
[im 34/100]
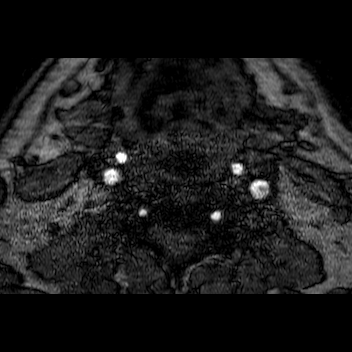
[im 50/100]
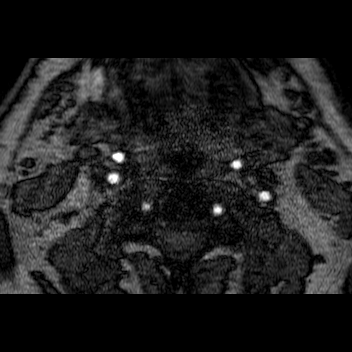
[im 67/100]
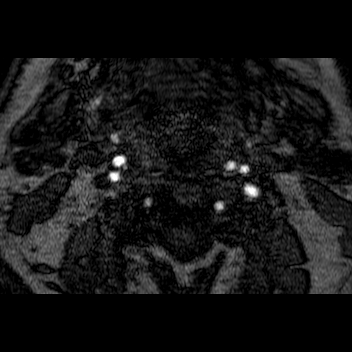
[im 83/100]
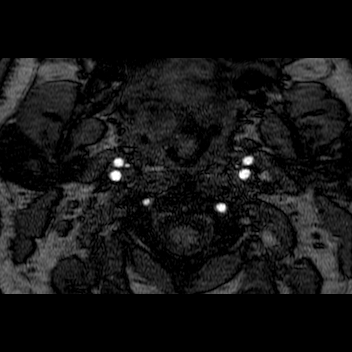
[im 100/100]
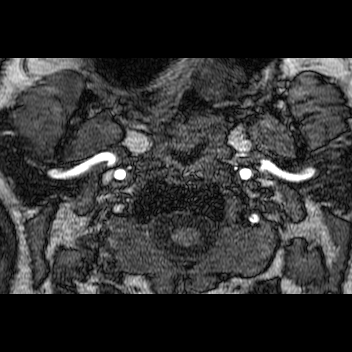

[16 of 16 positions shown; findings below may reference images not displayed]

FINDINGS: AORTIC ARCH: Normal configuration of the great vessel origins. 

RIGHT CAROTID:  Right brachiocephalic artery and common carotid artery (CCA) 
without significant stenosis.  Cervical internal carotid artery (ICA) without 
hemodynamically significant stenosis by NASCET criteria.  

LEFT CAROTID: Left CCA without significant stenosis.  Cervical ICA without 
hemodynamically significant stenosis by NASCET criteria. 

LEFT VERTEBRAL ARTERY: Left subclavian and vertebral artery without 
hemodynamically significant stenosis by WASID criteria. 
RIGHT VERTEBRAL ARTERY: Right subclavian and vertebral artery without 
hemodynamically significant stenosis by WASID criteria.  

NECK AND UPPER THORAX: Source images demonstrate no obvious large mass.
IMPRESSION: No hemodynamically significant stenoses of major arteries of the neck.

## 2023-03-19 IMAGING — MR MRI BRAIN W/WO CONTRAST
2 of 15 series · 5 of 48 positions shown · IV contrast (gadavist)
Comparison: CT from August 20, 2022.

________________________________________________________________________________________________ 
MRI BRAIN W/WO CONTRAST, 03/19/2023 [DATE]: 
CLINICAL INDICATION: Memory loss. History of aneurysm.
TECHNIQUE: Multiplanar, multiecho position MR images of the brain were performed 
without and with 12 mL of Gadavist were injected intravenously by hand. 3 mL of 
Gadavist were discarded.  Patient was scanned on a 1.5 Tesla magnet.

[Series 1802: T1 post-contrast · coronal · 1.0mm · 0.24mm/px · 3 of 180 slices shown (1 of 2)]
[im 26/180]
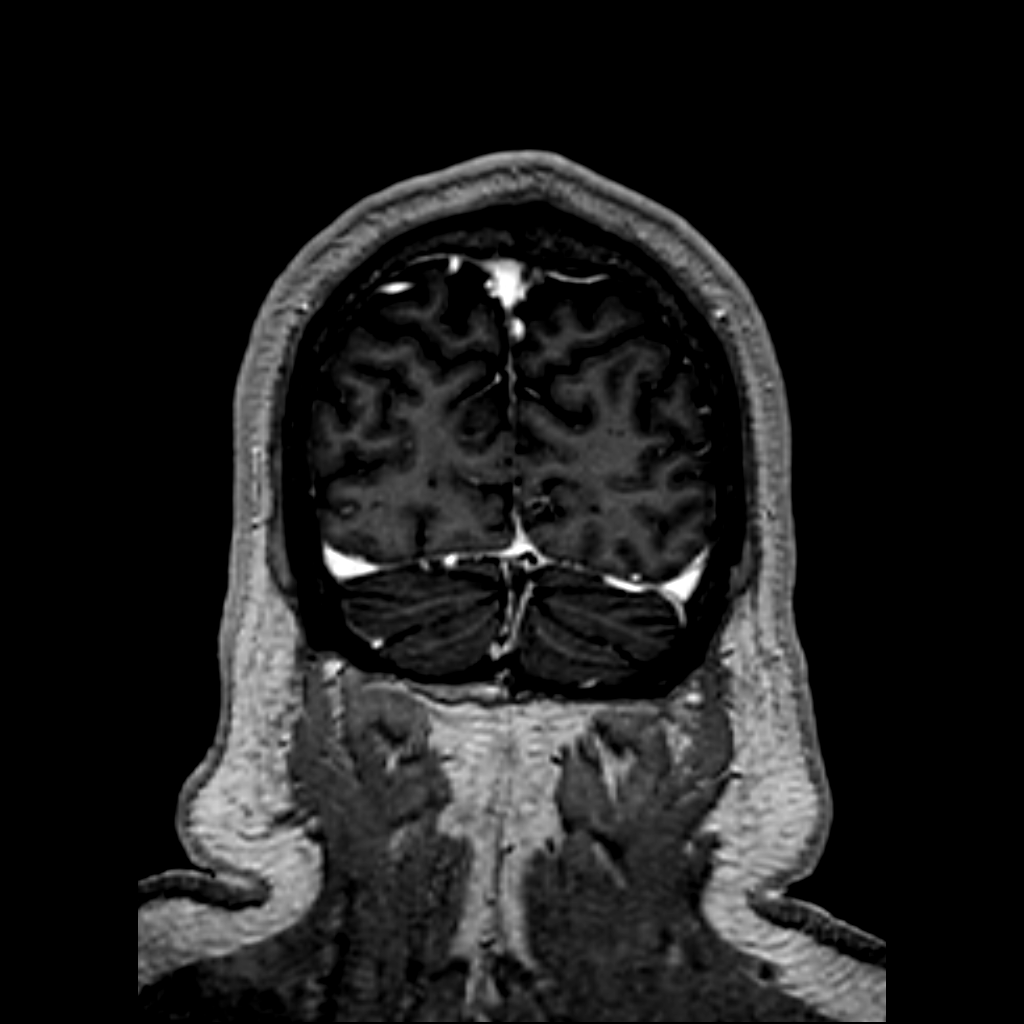
[im 103/180]
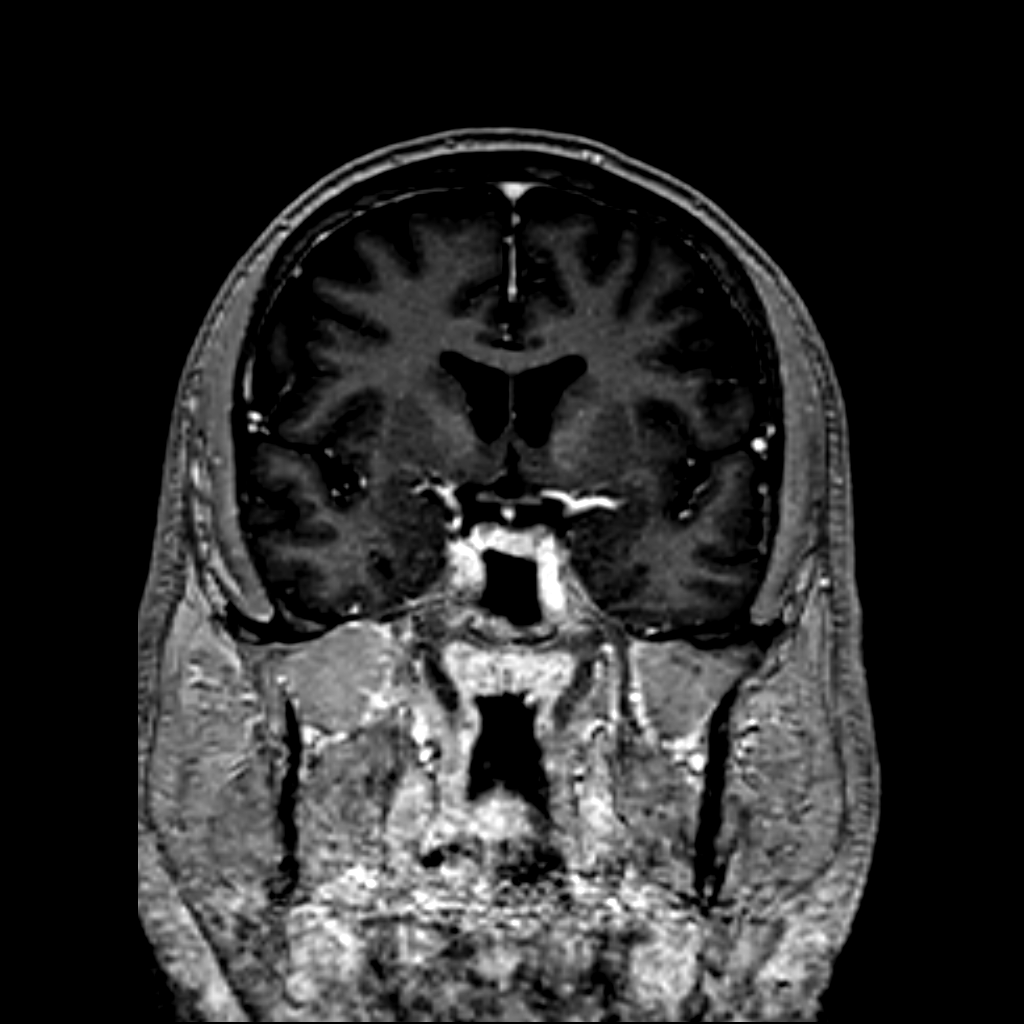
[im 154/180]
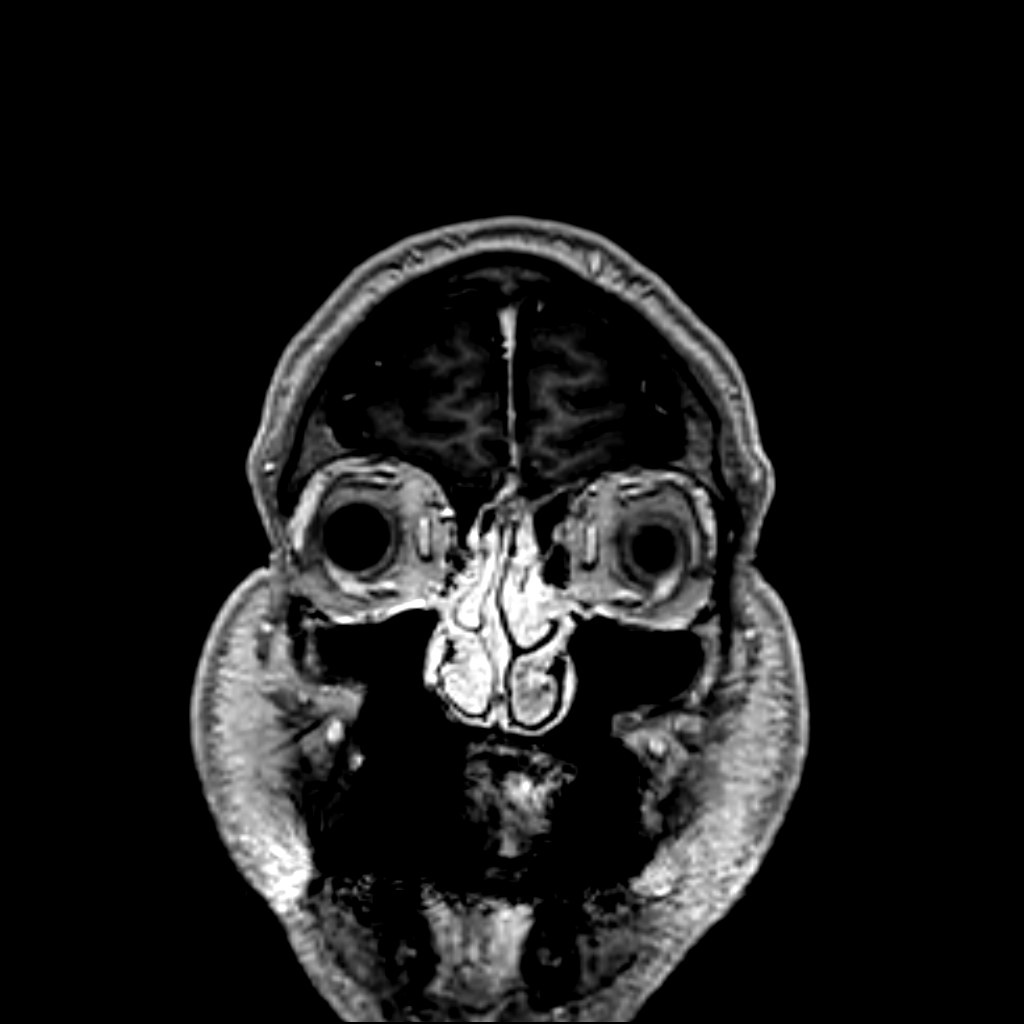

[Series 1803: T1 post-contrast · axial · 1.0mm · 0.24mm/px · z∈[-63,+8]mm · 2 of 170 slices shown (2 of 2)]
[im 25/170]
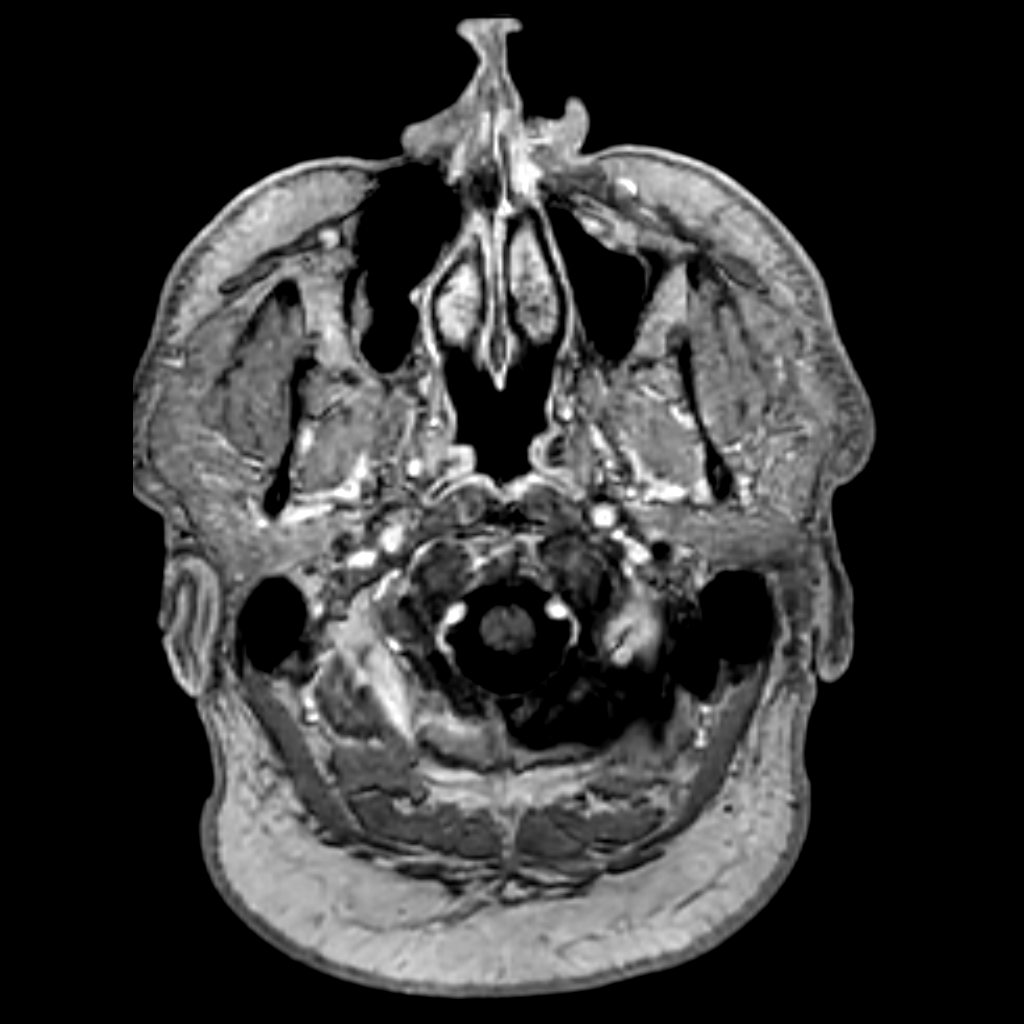
[im 97/170]
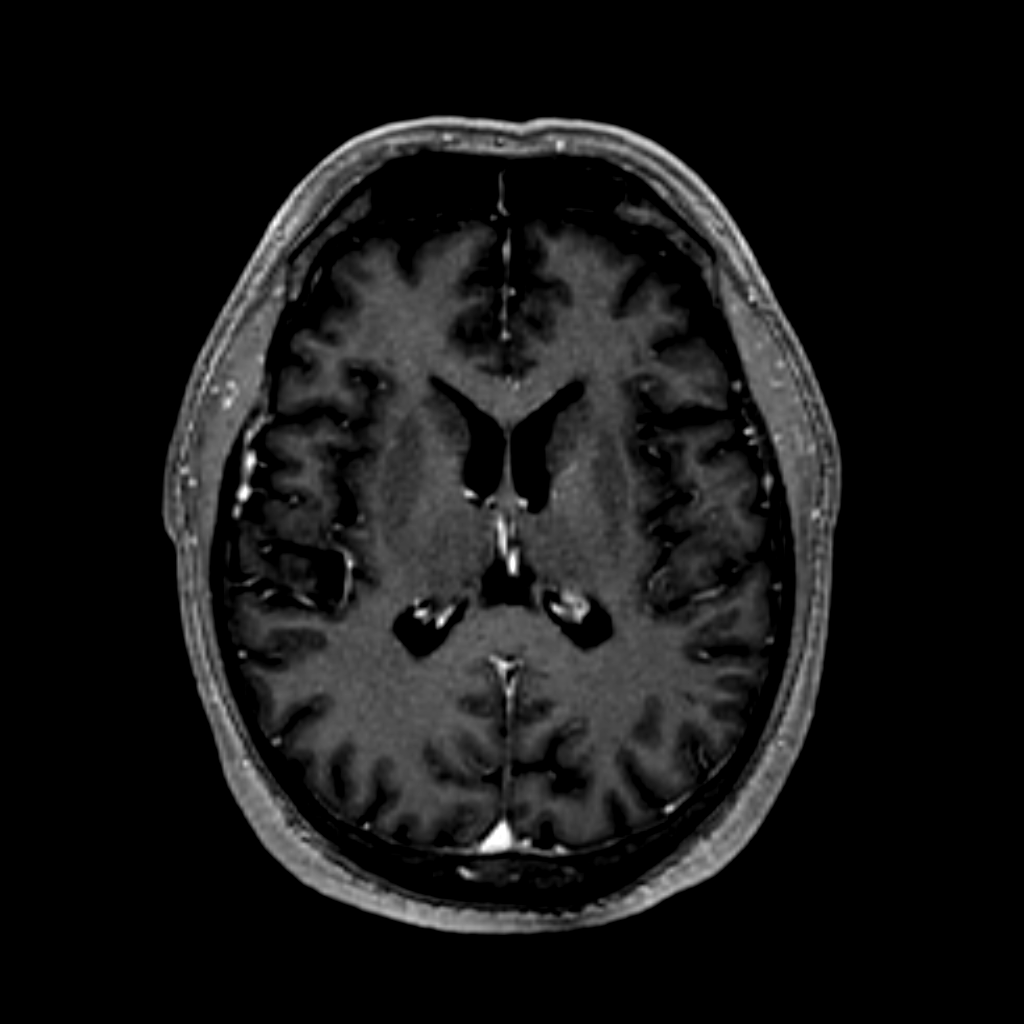

[5 of 48 positions shown; findings below may reference images not displayed]

FINDINGS: -------------------------------------------------------------------------------- 
------------------------- 
INTRACRANIAL: 
No acute ischemia. There are numerous foci of susceptibility artifact, punctate, 
throughout the brain. Patency of intracranial vascular flow voids. There are a 
few scattered foci of T2 prolongation in the right frontal and left posterior 
temporal subcortical to deep white matter, likely secondary to white matter 
microangiopathic change. No acute intracranial hemorrhage, mass effect, midline 
shift. No pathologic enhancement the brain. 
-------------------------------------------------------------------------------- 
----------------------- 
OTHER: 
ORBITS/SINUSES/T-BONES:  Visualized orbits show no acute abnormality or mass.  
Mild fluid in the right mastoid air cells.  Mild mucosal thickening in the 
paranasal sinuses. 
MARROW SIGNAL/SOFT TISSUES: No focal suspect signal abnormality. Thickening and 
enhancement along the right external auditory canal, nonspecific. 
-------------------------------------------------------------------------------- 
-------------------
IMPRESSION: 1.  No acute abnormality, mass, pathologic enhancement or brain. 
2.  Mild white matter microangiopathic change. 
3.  Foci of susceptibility artifact in the brain likely from chronic 
hypertensive microhemorrhage, alternatively this could represent early cerebral 
amyloid angiopathy. 
4.  Thickening and enhancement of the right external auditory canal, 
nonspecific, please correlate with direct visualization. 
5.  Given history of memory loss, if there is clinical concern for Alzheimers 
disease, consider amyloid PET/CT for further assessment.
